# Patient Record
Sex: Male | Born: 1955 | Race: White | Hispanic: No | Marital: Married | Smoking: Never smoker
Health system: Southern US, Community
[De-identification: ages and names within clinical notes are randomized; demographics above are authoritative.]

## PROBLEM LIST (undated history)

## (undated) DIAGNOSIS — I480 Paroxysmal atrial fibrillation: Secondary | ICD-10-CM

## (undated) DIAGNOSIS — J45909 Unspecified asthma, uncomplicated: Secondary | ICD-10-CM

## (undated) DIAGNOSIS — D3140 Benign neoplasm of unspecified ciliary body: Secondary | ICD-10-CM

## (undated) DIAGNOSIS — Q150 Congenital glaucoma: Secondary | ICD-10-CM

## (undated) DIAGNOSIS — Q245 Malformation of coronary vessels: Secondary | ICD-10-CM

## (undated) DIAGNOSIS — I25110 Atherosclerotic heart disease of native coronary artery with unstable angina pectoris: Principal | ICD-10-CM

## (undated) DIAGNOSIS — Z9841 Cataract extraction status, right eye: Secondary | ICD-10-CM

## (undated) DIAGNOSIS — E079 Disorder of thyroid, unspecified: Secondary | ICD-10-CM

## (undated) DIAGNOSIS — Z950 Presence of cardiac pacemaker: Secondary | ICD-10-CM

## (undated) DIAGNOSIS — Z961 Presence of intraocular lens: Secondary | ICD-10-CM

## (undated) DIAGNOSIS — K219 Gastro-esophageal reflux disease without esophagitis: Secondary | ICD-10-CM

## (undated) DIAGNOSIS — E785 Hyperlipidemia, unspecified: Secondary | ICD-10-CM

## (undated) DIAGNOSIS — Z947 Corneal transplant status: Secondary | ICD-10-CM

## (undated) DIAGNOSIS — H40051 Ocular hypertension, right eye: Secondary | ICD-10-CM

## (undated) DIAGNOSIS — I10 Essential (primary) hypertension: Secondary | ICD-10-CM

## (undated) DIAGNOSIS — Z955 Presence of coronary angioplasty implant and graft: Secondary | ICD-10-CM

## (undated) DIAGNOSIS — H18232 Secondary corneal edema, left eye: Secondary | ICD-10-CM

## (undated) DIAGNOSIS — H26499 Other secondary cataract, unspecified eye: Secondary | ICD-10-CM

## (undated) DIAGNOSIS — G4733 Obstructive sleep apnea (adult) (pediatric): Secondary | ICD-10-CM

## (undated) DIAGNOSIS — Z9842 Cataract extraction status, left eye: Secondary | ICD-10-CM

## (undated) HISTORY — DX: Cataract extraction status, right eye: Z98.41

## (undated) HISTORY — DX: Cataract extraction status, left eye: Z98.42

## (undated) HISTORY — DX: Disorder of thyroid, unspecified: E07.9

## (undated) HISTORY — DX: Gastro-esophageal reflux disease without esophagitis: K21.9

## (undated) HISTORY — DX: Benign neoplasm of unspecified ciliary body: D31.40

## (undated) HISTORY — DX: Essential (primary) hypertension: I10

## (undated) HISTORY — DX: Congenital glaucoma: Q15.0

## (undated) HISTORY — PX: WISDOM TOOTH EXTRACTION: SHX21

## (undated) HISTORY — DX: Obstructive sleep apnea (adult) (pediatric): G47.33

## (undated) HISTORY — PX: APPENDECTOMY: SHX54

## (undated) HISTORY — DX: Secondary corneal edema, left eye: H18.232

## (undated) HISTORY — DX: Other secondary cataract, unspecified eye: H26.499

## (undated) HISTORY — PX: CATARACT EXTRACTION: SUR2

## (undated) HISTORY — DX: Presence of cardiac pacemaker: Z95.0

## (undated) HISTORY — DX: Paroxysmal atrial fibrillation: I48.0

## (undated) HISTORY — DX: Presence of coronary angioplasty implant and graft: Z95.5

## (undated) HISTORY — DX: Hyperlipidemia, unspecified: E78.5

## (undated) HISTORY — DX: Presence of intraocular lens: Z96.1

## (undated) HISTORY — DX: Corneal transplant status: Z94.7

## (undated) HISTORY — DX: Malformation of coronary vessels: Q24.5

## (undated) HISTORY — DX: Unspecified asthma, uncomplicated: J45.909

## (undated) HISTORY — DX: Atherosclerotic heart disease of native coronary artery with unstable angina pectoris: I25.110

---

## 2001-03-30 HISTORY — PX: CATARACT EXTRACTION W/ INTRAOCULAR LENS IMPLANT: SHX1309

## 2012-01-29 HISTORY — PX: CATARACT EXTRACTION W/ INTRAOCULAR LENS IMPLANT: SHX1309

## 2012-04-30 HISTORY — PX: DESCEMETS STRIPPING AUTOMATED ENDOTHELIAL KERATOPLASTY: SHX1454

## 2012-12-23 DIAGNOSIS — Z961 Presence of intraocular lens: Secondary | ICD-10-CM | POA: Insufficient documentation

## 2012-12-23 DIAGNOSIS — H26499 Other secondary cataract, unspecified eye: Secondary | ICD-10-CM | POA: Insufficient documentation

## 2012-12-23 DIAGNOSIS — Q150 Congenital glaucoma: Secondary | ICD-10-CM | POA: Insufficient documentation

## 2012-12-23 DIAGNOSIS — H1820 Unspecified corneal edema: Secondary | ICD-10-CM | POA: Insufficient documentation

## 2013-01-30 DIAGNOSIS — Q134 Other congenital corneal malformations: Secondary | ICD-10-CM | POA: Insufficient documentation

## 2013-03-06 DIAGNOSIS — J101 Influenza due to other identified influenza virus with other respiratory manifestations: Secondary | ICD-10-CM | POA: Insufficient documentation

## 2013-03-15 DIAGNOSIS — Z947 Corneal transplant status: Secondary | ICD-10-CM | POA: Insufficient documentation

## 2013-09-05 DIAGNOSIS — Q150 Congenital glaucoma: Secondary | ICD-10-CM

## 2013-09-05 DIAGNOSIS — D3140 Benign neoplasm of unspecified ciliary body: Secondary | ICD-10-CM | POA: Insufficient documentation

## 2013-09-05 HISTORY — DX: Benign neoplasm of unspecified ciliary body: D31.40

## 2013-09-05 HISTORY — DX: Congenital glaucoma: Q15.0

## 2013-09-15 DIAGNOSIS — E039 Hypothyroidism, unspecified: Secondary | ICD-10-CM | POA: Insufficient documentation

## 2013-09-15 DIAGNOSIS — J45909 Unspecified asthma, uncomplicated: Secondary | ICD-10-CM | POA: Insufficient documentation

## 2013-09-15 DIAGNOSIS — K219 Gastro-esophageal reflux disease without esophagitis: Secondary | ICD-10-CM | POA: Insufficient documentation

## 2013-09-18 HISTORY — PX: GLAUCOMA SURGERY: SHX656

## 2013-09-18 HISTORY — PX: AQUEOUS SHUNT EXTRAOCULAR: SHX351

## 2013-09-18 HISTORY — PX: SCLERAL REINFORCEMENT: SHX371

## 2013-10-18 DIAGNOSIS — H18232 Secondary corneal edema, left eye: Secondary | ICD-10-CM | POA: Insufficient documentation

## 2013-10-18 DIAGNOSIS — Z947 Corneal transplant status: Secondary | ICD-10-CM | POA: Insufficient documentation

## 2013-11-21 DIAGNOSIS — Z9841 Cataract extraction status, right eye: Secondary | ICD-10-CM | POA: Insufficient documentation

## 2013-11-21 DIAGNOSIS — Z9842 Cataract extraction status, left eye: Secondary | ICD-10-CM

## 2013-11-21 HISTORY — DX: Cataract extraction status, left eye: Z98.41

## 2013-11-21 HISTORY — DX: Cataract extraction status, right eye: Z98.42

## 2014-03-07 HISTORY — PX: CARDIAC CATHETERIZATION: SHX172

## 2014-05-24 DIAGNOSIS — E782 Mixed hyperlipidemia: Secondary | ICD-10-CM | POA: Insufficient documentation

## 2014-05-24 DIAGNOSIS — D751 Secondary polycythemia: Secondary | ICD-10-CM | POA: Insufficient documentation

## 2014-05-24 DIAGNOSIS — R55 Syncope and collapse: Secondary | ICD-10-CM | POA: Insufficient documentation

## 2014-05-24 DIAGNOSIS — G4733 Obstructive sleep apnea (adult) (pediatric): Secondary | ICD-10-CM | POA: Insufficient documentation

## 2014-05-24 DIAGNOSIS — Z9989 Dependence on other enabling machines and devices: Secondary | ICD-10-CM

## 2014-05-24 DIAGNOSIS — IMO0002 Reserved for concepts with insufficient information to code with codable children: Secondary | ICD-10-CM | POA: Insufficient documentation

## 2014-05-28 DIAGNOSIS — R0602 Shortness of breath: Secondary | ICD-10-CM | POA: Insufficient documentation

## 2014-06-10 DIAGNOSIS — R7303 Prediabetes: Secondary | ICD-10-CM | POA: Insufficient documentation

## 2014-09-24 DIAGNOSIS — I4891 Unspecified atrial fibrillation: Secondary | ICD-10-CM | POA: Insufficient documentation

## 2014-09-28 HISTORY — PX: PACEMAKER INSERTION: SHX728

## 2014-11-21 LAB — PROTIME-INR

## 2015-01-17 HISTORY — PX: CORNEAL TRANSPLANT: SHX108

## 2016-01-15 ENCOUNTER — Telehealth: Payer: Self-pay | Admitting: Internal Medicine

## 2016-01-15 NOTE — Telephone Encounter (Signed)
Received records from [facility] for appointment on 02/28/16 with Dr Debara Pickett.  Records given to [facility] (medical records) for Dr Lysbeth Penner schedule on 02/28/16. lp

## 2016-02-28 ENCOUNTER — Telehealth: Payer: Self-pay | Admitting: Internal Medicine

## 2016-02-28 ENCOUNTER — Ambulatory Visit (INDEPENDENT_AMBULATORY_CARE_PROVIDER_SITE_OTHER): Payer: BLUE CROSS/BLUE SHIELD | Admitting: Internal Medicine

## 2016-02-28 ENCOUNTER — Encounter: Payer: Self-pay | Admitting: Internal Medicine

## 2016-02-28 VITALS — BP 108/74 | HR 69 | Ht 70.0 in | Wt 267.0 lb

## 2016-02-28 DIAGNOSIS — I480 Paroxysmal atrial fibrillation: Secondary | ICD-10-CM | POA: Diagnosis not present

## 2016-02-28 DIAGNOSIS — Z955 Presence of coronary angioplasty implant and graft: Secondary | ICD-10-CM

## 2016-02-28 DIAGNOSIS — I25110 Atherosclerotic heart disease of native coronary artery with unstable angina pectoris: Secondary | ICD-10-CM

## 2016-02-28 DIAGNOSIS — I10 Essential (primary) hypertension: Secondary | ICD-10-CM | POA: Diagnosis not present

## 2016-02-28 DIAGNOSIS — Z9889 Other specified postprocedural states: Secondary | ICD-10-CM

## 2016-02-28 DIAGNOSIS — Z950 Presence of cardiac pacemaker: Secondary | ICD-10-CM

## 2016-02-28 DIAGNOSIS — Z9861 Coronary angioplasty status: Secondary | ICD-10-CM

## 2016-02-28 DIAGNOSIS — I2510 Atherosclerotic heart disease of native coronary artery without angina pectoris: Secondary | ICD-10-CM | POA: Insufficient documentation

## 2016-02-28 DIAGNOSIS — Q245 Malformation of coronary vessels: Secondary | ICD-10-CM

## 2016-02-28 HISTORY — DX: Presence of cardiac pacemaker: Z95.0

## 2016-02-28 HISTORY — DX: Atherosclerotic heart disease of native coronary artery with unstable angina pectoris: I25.110

## 2016-02-28 HISTORY — DX: Malformation of coronary vessels: Q24.5

## 2016-02-28 HISTORY — DX: Essential (primary) hypertension: I10

## 2016-02-28 HISTORY — DX: Presence of coronary angioplasty implant and graft: Z95.5

## 2016-02-28 HISTORY — DX: Paroxysmal atrial fibrillation: I48.0

## 2016-02-28 LAB — TSH: TSH: 3.92 mIU/L (ref 0.40–4.50)

## 2016-02-28 LAB — LIPID PANEL
CHOL/HDL RATIO: 3.5 ratio (ref <5.0)
CHOLESTEROL: 115 mg/dL (ref <200)
HDL: 33 mg/dL — AB (ref >40)
LDL Cholesterol: 52 mg/dL (ref <100)
TRIGLYCERIDES: 150 mg/dL — AB (ref <150)
VLDL: 30 mg/dL (ref <30)

## 2016-02-28 LAB — COMPREHENSIVE METABOLIC PANEL
ALBUMIN: 4.4 g/dL (ref 3.6–5.1)
ALT: 50 U/L — ABNORMAL HIGH (ref 9–46)
AST: 26 U/L (ref 10–35)
Alkaline Phosphatase: 82 U/L (ref 40–115)
BUN: 17 mg/dL (ref 7–25)
CALCIUM: 9.1 mg/dL (ref 8.6–10.3)
CO2: 26 mmol/L (ref 20–31)
Chloride: 101 mmol/L (ref 98–110)
Creat: 1.04 mg/dL (ref 0.70–1.33)
GLUCOSE: 118 mg/dL — AB (ref 65–99)
POTASSIUM: 4.3 mmol/L (ref 3.5–5.3)
Sodium: 138 mmol/L (ref 135–146)
Total Bilirubin: 1 mg/dL (ref 0.2–1.2)
Total Protein: 6.4 g/dL (ref 6.1–8.1)

## 2016-02-28 LAB — CBC
HEMATOCRIT: 45.5 % (ref 38.5–50.0)
Hemoglobin: 15.1 g/dL (ref 13.2–17.1)
MCH: 28.5 pg (ref 27.0–33.0)
MCHC: 33.2 g/dL (ref 32.0–36.0)
MCV: 86 fL (ref 80.0–100.0)
MPV: 8.7 fL (ref 7.5–12.5)
PLATELETS: 209 10*3/uL (ref 140–400)
RBC: 5.29 MIL/uL (ref 4.20–5.80)
RDW: 14.8 % (ref 11.0–15.0)
WBC: 5.1 10*3/uL (ref 3.8–10.8)

## 2016-02-28 MED ORDER — ISOSORBIDE MONONITRATE ER 60 MG PO TB24: 60.0000 mg | ORAL_TABLET | Freq: Every day | ORAL | 6 refills | Status: DC

## 2016-02-28 MED ORDER — METOPROLOL SUCCINATE ER 50 MG PO TB24: 50.0000 mg | ORAL_TABLET | Freq: Two times a day (BID) | ORAL | 12 refills | Status: DC

## 2016-02-28 MED ORDER — METOPROLOL SUCCINATE ER 25 MG PO TB24: 37.5000 mg | ORAL_TABLET | Freq: Every day | ORAL | 12 refills | Status: DC

## 2016-02-28 NOTE — Progress Notes (Signed)
OFFICE NOTE  Chief Complaint:  Chest pain, additional opinion  Primary Care Physician: No PCP Per Patient  HPI:  Thomas Walsh is a 60 y.o. male who was referred from another patient of mine who is his brother-in-law. His primary physician is [name], MD and he sees Dr. Marcello Moores of [facility] in [place], [place]. Thomas Walsh has a very complex cardiac history. He was noted to have had a left heart catheterization at [facility] in 2000 for profound symptomatic bradycardia with heart rate of 30s related to timolol eyedrops, but was found to have no obstructive coronary disease at the time. There is a family history of premature coronary artery disease Apparently in late 2015 he saw Dr. Marcello Moores in the office because of chest discomfort. He had described that as "poking, pressure and squeezing which awoke him from sleep". He was scheduled to have a stress test however during the EKG portion of the stress test there were changes suggestive of ischemia and the test was discontinued. Interpretation of that nuclear stress test was "myocardial perfusion imaging is positive for inducible ischemia. There are regions of reversible ischemia located in the inferior wall of the heart. Overall left ventricular systolic function is normal without regional wall motion abnormalities noted above." He was then going to be scheduled for elective heart catheterization however he presented to the emergency department due to continued chest pain. He underwent left heart catheterization on 03/07/2014. 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 After the procedure however he continued to have chest pain symptoms. He was placed on aspirin and Effient and started on low-dose calcium channel  blocker which caused low blood pressure and was discontinued. Cardiac markers were noted to be mildly elevated the next day at 0.41 troponin which was previously negative on 03/06/2014. LDL at that time was 52. He was then admitted to [facility] in [place], [place] for chest pain in January 2016. He also complained of palpitations and was found to have an ectopic atrial rhythm with normal ventricular response. He claims that he had had significant tachycardia and was thought to have had an SVT. He reports he subsequently presented and required treatment for an SVT on another occasion when he presented to the hospital around [place], [place]. He said that he received adenosine 6 mg and then 12 mg and ultimately converted to sinus. He then was reportedly referred to Dr. Nyra Market in [facility] who scheduled him for ablation. Apparently afterwards he reports that the ablation was not totally successful however a pacemaker was required to be placed - this leads me to believe that there may have been AV nodal damage at the time. 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 The pacemaker is by his report a Medtronic device and he does have a home monitoring. He said most recently he was 94% paced. He has had progressive antianginal regimens including Ranexa and topical nitrate. 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 He underwent his last cardiac catheterization in September 2017 which demonstrated only 30% stenosis at the distal edge of the mid LAD stent. FFR was performed which was 0.89. Apparently he was continuing to have recurrent arrhythmias. Thomas Walsh also has had an opinion from a  cardiologist at [facility]. I cannot find records in care everywhere to support this however he does have some paperwork indicating he had an appointment. He does not feel that that appointment was helpful. He did bring a CD with images of his echo  and cath which I will review. His main complaint today is continued chest pain.   PMHx:  Past Medical History:  Diagnosis Date  . Coronary artery disease involving native coronary artery of native heart with unstable angina pectoris ([place]) 02/28/2016  . Essential hypertension 02/28/2016  . Glaucoma   . 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 PAF (paroxysmal atrial fibrillation) ([place]) 02/28/2016  . S/P placement of cardiac pacemaker 02/28/2016   Medtronic  . S/P primary angioplasty with coronary stent 02/28/2016  . Thyroid disease     History reviewed. No pertinent surgical history.  FAMHx:  Family History  Problem Relation Age of Onset  . Cancer Mother   . Kidney disease Father     SOCHx:   reports that he has never smoked. He has never used smokeless tobacco. He reports that he drinks about 0.6 oz of alcohol per week . He reports that he does not use drugs.  ALLERGIES:  Allergies  Allergen Reactions  . Timolol Anaphylaxis    Stops heart     ROS: Pertinent items noted in HPI and remainder of comprehensive ROS otherwise negative.  HOME MEDS: No current outpatient prescriptions on file prior to visit.   No current facility-administered medications on file prior to visit.     LABS/IMAGING: No results found for this or any previous visit (from the past 48 hour(s)). No results found.  WEIGHTS: Wt Readings from Last 3 Encounters:  02/28/16 267 lb (121.1 kg)    VITALS: BP 108/74   Pulse 69   Ht 5\' 10"  (1.778 m)   Wt 267 lb (121.1 kg)   BMI 38.31 kg/m   EXAM: General appearance: alert, no distress and moderately obese Neck: no carotid bruit and no JVD Lungs: clear to auscultation bilaterally Heart: regular rate and rhythm and S1, S2 normal Abdomen: soft, non-tender; bowel sounds normal; no masses,  no organomegaly Extremities: extremities normal, atraumatic, no cyanosis or edema Pulses: 2+ and symmetric Skin: Skin color, texture, turgor normal. No rashes or  lesions Neurologic: Grossly normal Psych: Pleasant  EKG: Normal sinus rhythm at 69, moderate voltage criteria for LVH, nonspecific T-wave changes  ASSESSMENT: 1. Coronary artery disease with persistent angina 2. 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 History of SVT with ablation and need for subsequent PPM 4. S/p PPM (Medtronic) 5. Dyslipidemia 6. Hypertension  PLAN: 1.   Thomas Walsh has a complex cardiac history which started with chest pain symptoms and ultimately he was noted to have a reversible defect inferiorly on the stress test but during cardiac catheterization had a 99% mid LAD stenosis. Reports indicate that this was stented however was also thought to be a myocardial bridge. I discussed with Thomas Walsh today that it is not standard of care to put stents in myocardial bridges, in fact the myocardial bridge rarely develops any obstructive coronary artery disease for unknown reasons. Typically stenting leads to more problems including recurrent anginal symptoms. I'm not surprised that he is required recurrent interventions to that vessel. It was thought that he may ultimately need coronary artery bypass grafting. Because of the depth of the myocardial bridge she was told that he was not a candidate for coronary on roofing, but he  has not had either cardiac MRI or CT to definitively evaluate this. I agree that additional medical therapy should be tried before considering bypass. I would recommend increasing his Toprol from 75 mg to 100 mg daily. In addition, I will switch his nitroglycerin patch over to indoor 60 mg daily. We will schedule him for our EP clinic and have his pacemaker device interrogated. I will request additional Cath Lab films and review his echo and films that I was provided with. Follow-up with me in about one month.  Pixie Casino, MD, [employer] Attending Cardiologist [place] 02/28/2016, 4:08 PM

## 2016-02-28 NOTE — Telephone Encounter (Signed)
[employer]) is calling because there were two prescriptions sent for Metoprolol and is needing verification on which one to use . Please call   Thanks

## 2016-02-28 NOTE — Telephone Encounter (Signed)
Spoke with Delisha and made her aware that pt's Toprol XL was increased to 50mg  BID today.  [facility] appreciative for assistance.

## 2016-02-28 NOTE — Patient Instructions (Addendum)
Medication Instructions:   INCREASE METOPROLOL TO 50 MG TWICE DAILY  STOP NITRO DUR PATCH  START ISOSORBIDE 60 MG ONCE DAILY  Labwork:  Your physician recommends that you HAVE LAB WORK TODAY  Follow-Up:  Your physician recommends that you schedule a follow-up appointment in: ONE MONTH WITH DR HILTY   REFERRAL TO EP WITHIN 2 WEEKS FOR HX OF SVT ABLATION/PACER AND PALPITATIONS

## 2016-03-02 ENCOUNTER — Telehealth: Payer: Self-pay | Admitting: Internal Medicine

## 2016-03-02 NOTE — Telephone Encounter (Signed)
Received records from [facility] per Dr [facility] request.  Records given to Dr Debara Pickett for review.  Patient has appointment with Dr Debara Pickett also on 04/03/16.  Patient has appointment with Dr Lovena Le on 03/10/16.  Sent copy of records to [facility] Chart Prep for Dr Tanna Furry schedule on 03/10/16. lp

## 2016-03-02 NOTE — Telephone Encounter (Signed)
Faxed Release signed by patient to [facility] to obtain records per Dr [facility] request.  Faxed 03/02/16 - [phone])  lp

## 2016-03-04 ENCOUNTER — Encounter: Payer: Self-pay | Admitting: *Deleted

## 2016-03-05 ENCOUNTER — Encounter: Payer: Self-pay | Admitting: *Deleted

## 2016-03-05 ENCOUNTER — Encounter: Payer: Self-pay | Admitting: Internal Medicine

## 2016-03-05 DIAGNOSIS — G4730 Sleep apnea, unspecified: Secondary | ICD-10-CM | POA: Insufficient documentation

## 2016-03-10 ENCOUNTER — Ambulatory Visit (INDEPENDENT_AMBULATORY_CARE_PROVIDER_SITE_OTHER): Payer: BLUE CROSS/BLUE SHIELD | Admitting: Internal Medicine

## 2016-03-10 ENCOUNTER — Encounter: Payer: Self-pay | Admitting: Internal Medicine

## 2016-03-10 VITALS — BP 90/64 | HR 78 | Ht 70.0 in | Wt 269.6 lb

## 2016-03-10 DIAGNOSIS — R079 Chest pain, unspecified: Secondary | ICD-10-CM | POA: Diagnosis not present

## 2016-03-10 DIAGNOSIS — R0602 Shortness of breath: Secondary | ICD-10-CM

## 2016-03-10 DIAGNOSIS — Z950 Presence of cardiac pacemaker: Secondary | ICD-10-CM | POA: Diagnosis not present

## 2016-03-10 DIAGNOSIS — R002 Palpitations: Secondary | ICD-10-CM | POA: Diagnosis not present

## 2016-03-10 NOTE — Progress Notes (Signed)
HPI [name] is referred by Dr. Debara Pickett for ongoing evaluation of SVT, s/p ablation, and complicated by the insertion of a DDD PM. He has known CAD and his history has been reviewed and notable for at least 4 cath/PCI procedures. His main complaint today is dyspnea with exertion, and overall fatigue. He has trouble walking up a grade andtrouble walking fast. He is overweight, having gained over a 100 lbs since high school graduation. He denies peripheral edema. No syncope and no systemic symptoms like fever or chills. Allergies  Allergen Reactions  . Timolol Anaphylaxis    Cardiac stand still  Stops heart      Current Outpatient Prescriptions  Medication Sig Dispense Refill  . aspirin 325 MG tablet Take 325 mg by mouth daily.    Marland Kitchen atorvastatin (LIPITOR) 40 MG tablet Take 40 mg by mouth daily.    . Brinzolamide-Brimonidine (SIMBRINZA) 1-0.2 % SUSP Apply 1 drop to eye 3 (three) times daily.    Marland Kitchen ezetimibe (ZETIA) 10 MG tablet Take 10 mg by mouth daily.    . isosorbide mononitrate (IMDUR) 60 MG 24 hr tablet Take 1 tablet (60 mg total) by mouth daily. 30 tablet 6  . levothyroxine (SYNTHROID, LEVOTHROID) 100 MCG tablet Take 100 mcg by mouth daily before breakfast.    . liraglutide (VICTOZA) 18 MG/3ML SOPN Inject 18 mg into the skin daily.    Marland Kitchen lisinopril (PRINIVIL,ZESTRIL) 10 MG tablet Take 10 mg by mouth daily.    . metoprolol succinate (TOPROL-XL) 50 MG 24 hr tablet Take 1 tablet (50 mg total) by mouth 2 (two) times daily. 60 tablet 12  . nitroGLYCERIN (NITROSTAT) 0.4 MG SL tablet Place 0.4 mg under the tongue every 5 (five) minutes as needed for chest pain.    Marland Kitchen omeprazole (PRILOSEC) 40 MG capsule Take 40 mg by mouth 2 (two) times daily.    . prasugrel (EFFIENT) 10 MG TABS tablet Take 10 mg by mouth daily.    . ranolazine (RANEXA) 500 MG 12 hr tablet Take 1,000 mg by mouth 2 (two) times daily.     No current facility-administered medications for this visit.      Past Medical  History:  Diagnosis Date  . Asthma   . 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 Coronary artery disease involving native coronary artery of native heart with unstable angina pectoris ([place]) 02/28/2016  . Essential hypertension 02/28/2016  . GERD (gastroesophageal reflux disease)   . HLD (hyperlipidemia)   . 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 OSA treated with BiPAP   . PAF (paroxysmal atrial fibrillation) ([place]) 02/28/2016  . PCO (posterior capsular opacification)   . Pseudophakia of both eyes   . S/P bilateral cataract extraction 11/21/2013  . S/P placement of cardiac pacemaker 02/28/2016   Medtronic  . S/P primary angioplasty with coronary stent 02/28/2016  . Secondary corneal edema of left eye   . Status post corneal transplant   . Thyroid disease     ROS:   All systems reviewed and negative except as noted in the HPI.   Past Surgical History:  Procedure Laterality Date  . APPENDECTOMY    . AQUEOUS SHUNT EXTRAOCULAR Left 09/18/2013   Elenore Rota L. Pennie Banter, MD [address] [facility]  . CARDIAC CATHETERIZATION  03/07/2014  . CATARACT EXTRACTION Bilateral   . CATARACT EXTRACTION W/ INTRAOCULAR LENS IMPLANT Left 01/2012   McCuen, MD  . CATARACT EXTRACTION W/ INTRAOCULAR LENS IMPLANT Right 2003   [facility]  . CORNEAL  TRANSPLANT Left 01/17/2015   Keratoplasty, Endothelial. Nelma Rothman, MD [place]   . DESCEMETS STRIPPING AUTOMATED ENDOTHELIAL KERATOPLASTY Left 04/2012   Marilynne Halsted, MD;  . GLAUCOMA SURGERY Left 09/18/2013   [place] BG-101 implant with cornea graft  . PACEMAKER INSERTION  09/2014   Done at [facility].   [place] Levo REINFORCEMENT Left 09/18/2013   with graft, Edson Snowball, MD, [facility]  . WISDOM TOOTH EXTRACTION       Family History  Problem Relation Age of Onset  . Colon cancer Mother   . Hypertension Mother   . Kidney disease Father   . CAD Father   . Stroke Father   . Spina bifida Sister   . Thyroid disease Sister   . Diabetes Brother   .  Retinal detachment Brother      Social History   Social History  . Marital status: Married    Spouse name: N/A  . Number of children: N/A  . Years of education: N/A   Occupational History  . [employer]     Social History Main Topics  . Smoking status: Never Smoker  . Smokeless tobacco: Never Used  . Alcohol use 0.6 oz/week    1 Shots of liquor per week  . Drug use: No  . Sexual activity: Not on file   Other Topics Concern  . Not on file   Social History Narrative  . No narrative on file     BP 90/64 (BP Location: Left Arm, Patient Position: Sitting, Cuff Size: Large)   Pulse 78   Ht 5\' 10"  (1.778 m)   Wt 269 lb 9.6 oz (122.3 kg)   BMI 38.68 kg/m   Physical Exam:  obese appearing middle aged man, NAD HEENT: Unremarkable Neck:  7 cm JVD, no thyromegally Lymphatics:  No adenopathy Back:  No CVA tenderness Lungs:  Clear with no wheezes, rales, or rhonchi. Well healed PPM incision. HEART:  Regular rate rhythm, no murmurs, no rubs, no clicks Abd:  soft, positive bowel sounds, no organomegally, no rebound, no guarding Ext:  2 plus pulses, no edema, no cyanosis, no clubbing Skin:  No rashes no nodules Neuro:  CN II through XII intact, motor grossly intact  DEVICE  Normal device function.  See PaceArt for details.   Assess/Plan: 1. Dyspnea with exertion - the etiology is unclear. He is not pacing and is not having any arrhythmias. No angina. He is frustrated by his inability to exert himself. I have recommended he undergo cardiopulmonary stress testing.  2. CAD - he is s/p multiple stenting/PCI procedures.  3. PPM - his medtronic PPM is working normally. He does not have CHB today and is sensing in both the atrium and the ventricle. 4. SVT - he has had no SVT in over a year based on interogation of his PPM. I have recommended watchful waiting. No indication for EP study at this time. He will continue his beta blocker.  Mikle Bosworth.D.

## 2016-03-10 NOTE — Patient Instructions (Addendum)
Medication Instructions:  Your physician recommends that you continue on your current medications as directed. Please refer to the Current Medication list given to you today.   Labwork: None Ordered   Testing/Procedures: Cardiopulmonary Exercise Test   Follow-Up: Follow-up to be determined after results of Cardiopulmonary Exercise Test.     Any Other Special Instructions Will Be Listed Below (If Applicable).     If you need a refill on your cardiac medications before your next appointment, please call your pharmacy.

## 2016-03-10 NOTE — Progress Notes (Signed)
I have reviewed Thomas Walsh records from [facility] cardi[facility] in [place], [place]. He was treated by Dr. Thornton Dales. The records indicate he underwent placement of a Medtronic advise a DR MRI sure scan pacemaker on 10/18/2014 for sick sinus syndrome, dizziness and fatigue. He has a history of paroxysmal atrial fibrillation however reportedly a very low burden of A. fib. 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   Pixie Casino, MD, [facility] Attending Cardiologist [place]

## 2016-03-16 ENCOUNTER — Encounter: Payer: Self-pay | Admitting: Internal Medicine

## 2016-04-02 ENCOUNTER — Other Ambulatory Visit (HOSPITAL_COMMUNITY): Payer: Self-pay | Admitting: *Deleted

## 2016-04-02 ENCOUNTER — Ambulatory Visit (HOSPITAL_COMMUNITY): Payer: BLUE CROSS/BLUE SHIELD | Attending: Internal Medicine

## 2016-04-02 DIAGNOSIS — R0602 Shortness of breath: Secondary | ICD-10-CM | POA: Insufficient documentation

## 2016-04-02 DIAGNOSIS — R079 Chest pain, unspecified: Secondary | ICD-10-CM

## 2016-04-02 DIAGNOSIS — R0600 Dyspnea, unspecified: Secondary | ICD-10-CM | POA: Diagnosis not present

## 2016-04-03 ENCOUNTER — Ambulatory Visit (INDEPENDENT_AMBULATORY_CARE_PROVIDER_SITE_OTHER): Payer: BLUE CROSS/BLUE SHIELD | Admitting: Internal Medicine

## 2016-04-03 ENCOUNTER — Encounter: Payer: Self-pay | Admitting: Internal Medicine

## 2016-04-03 VITALS — BP 112/70 | HR 75 | Ht 70.0 in | Wt 266.8 lb

## 2016-04-03 DIAGNOSIS — Z950 Presence of cardiac pacemaker: Secondary | ICD-10-CM | POA: Diagnosis not present

## 2016-04-03 DIAGNOSIS — I25110 Atherosclerotic heart disease of native coronary artery with unstable angina pectoris: Secondary | ICD-10-CM

## 2016-04-03 DIAGNOSIS — Q245 Malformation of coronary vessels: Secondary | ICD-10-CM

## 2016-04-03 DIAGNOSIS — R002 Palpitations: Secondary | ICD-10-CM

## 2016-04-03 DIAGNOSIS — I10 Essential (primary) hypertension: Secondary | ICD-10-CM

## 2016-04-03 DIAGNOSIS — R0602 Shortness of breath: Secondary | ICD-10-CM | POA: Diagnosis not present

## 2016-04-03 NOTE — Patient Instructions (Signed)
Your physician wants you to follow-up in: 6 months with Dr. Hilty. You will receive a reminder letter in the mail two months in advance. If you don't receive a letter, please call our office to schedule the follow-up appointment.    

## 2016-04-03 NOTE — Progress Notes (Signed)
OFFICE NOTE  Chief Complaint:  Follow-up testing  Primary Care Physician: Pcp Not In System  HPI:  Thomas Walsh is a 61 y.o. male who was referred from another patient of mine who is his brother-in-law. His primary physician is [name], MD and he sees Dr. Marcello Moores of [facility] in [place], [place]. [name] has a very complex cardiac history. He was noted to have had a left heart catheterization at [facility] in 2000 for profound symptomatic bradycardia with heart rate of 30s related to timolol eyedrops, but was found to have no obstructive coronary disease at the time. There is a family history of premature coronary artery disease Apparently in late 2015 he saw Dr. Marcello Moores in the office because of chest discomfort. He had described that as "poking, pressure and squeezing which awoke him from sleep". He was scheduled to have a stress test however during the EKG portion of the stress test there were changes suggestive of ischemia and the test was discontinued. Interpretation of that nuclear stress test was "myocardial perfusion imaging is positive for inducible ischemia. There are regions of reversible ischemia located in the inferior wall of the heart. Overall left ventricular systolic function is normal without regional wall motion abnormalities noted above." He was then going to be scheduled for elective heart catheterization however he presented to the emergency department due to continued chest pain. He underwent left heart catheterization on 03/07/2014. 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 After the procedure however he continued to have chest pain symptoms. He was placed on aspirin and Effient and started on low-dose calcium channel blocker which  caused low blood pressure and was discontinued. Cardiac markers were noted to be mildly elevated the next day at 0.41 troponin which was previously negative on 03/06/2014. LDL at that time was 52. He was then admitted to [facility] in [place], [place] for chest pain in January 2016. He also complained of palpitations and was found to have an ectopic atrial rhythm with normal ventricular response. He claims that he had had significant tachycardia and was thought to have had an SVT. He reports he subsequently presented and required treatment for an SVT on another occasion when he presented to the hospital around [place], [place]. He said that he received adenosine 6 mg and then 12 mg and ultimately converted to sinus. He then was reportedly referred to Dr. Nyra Market in [facility] who scheduled him for ablation. Apparently afterwards he reports that the ablation was not totally successful however a pacemaker was required to be placed - this leads me to believe that there may have been AV nodal damage at the time. 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 The pacemaker is by his report a Medtronic device and he does have a home monitoring. He said most recently he was 94% paced. He has had progressive antianginal regimens including Ranexa and topical nitrate. 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 He underwent his last cardiac catheterization in September 2017 which demonstrated only 30% stenosis at the distal edge of the mid LAD stent. FFR was performed which was 0.89. Apparently he was continuing to have recurrent arrhythmias. Mr. Kimery also has had an opinion from a cardiologist at  [facility]. I cannot find records in care everywhere to support this however he does have some paperwork indicating he had an appointment. He does not feel that that appointment was helpful. He did bring a CD with images of his echo and cath which I  will review. His main complaint today is continued chest pain.   04/03/2016  [name] returns today for follow-up. I was able to review his extensive records and understand he is history including placement of a Medtronic advise a MRI safe pacemaker on 10/18/2014 for sick sinus syndrome. Subsequently he developed some PAF but reportedly has a very low burden of this. 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 Recently he saw Dr. Lovena Le and was not felt to be in heart block or to be pacer dependent. Because of his dyspnea he was scheduled for a cardiopulmonary exercise test. Mr. Peragine did undergo that study which indicated no significant circulatory limitation to exercise. His peak V02 was 103%. Pulmonary function was within normal limits. It was felt that likely his symptoms were related to obesity. These are preliminary results pending a cardiology over read, but I agree with those findings. He says that he said much less angina since adjusting his medications. He is now on imdur 60 mg daily as compared to his nitroglycerin patch. Although he has some mild headaches he says that he has not needed any short acting nitroglycerin in the last 3 weeks. He is also on an increased dose of metoprolol, namely 100 mg daily. He is generally pleased with his improvement in symptoms.  PMHx:  Past Medical History:  Diagnosis Date  . Asthma   . 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 Coronary artery disease involving native coronary artery of native heart with unstable angina pectoris ([place]) 02/28/2016  . Essential hypertension 02/28/2016  . GERD (gastroesophageal reflux disease)   . HLD (hyperlipidemia)   . 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 OSA treated with BiPAP   . PAF (paroxysmal atrial fibrillation) ([place]) 02/28/2016  . PCO (posterior capsular opacification)   . Pseudophakia of both eyes   . S/P bilateral  cataract extraction 11/21/2013  . S/P placement of cardiac pacemaker 02/28/2016   Medtronic  . S/P primary angioplasty with coronary stent 02/28/2016  . Secondary corneal edema of left eye   . Status post corneal transplant   . Thyroid disease     Past Surgical History:  Procedure Laterality Date  . APPENDECTOMY    . AQUEOUS SHUNT EXTRAOCULAR Left 09/18/2013   Elenore Rota L. Pennie Banter, MD [facility]  . CARDIAC CATHETERIZATION  03/07/2014  . CATARACT EXTRACTION Bilateral   . CATARACT EXTRACTION W/ INTRAOCULAR LENS IMPLANT Left 01/2012   McCuen, MD  . CATARACT EXTRACTION W/ INTRAOCULAR LENS IMPLANT Right 2003   [place]  . CORNEAL TRANSPLANT Left 01/17/2015   Keratoplasty, Endothelial. Nelma Rothman, MD [place]   . DESCEMETS STRIPPING AUTOMATED ENDOTHELIAL KERATOPLASTY Left 04/2012   Marilynne Halsted, MD;  . GLAUCOMA SURGERY Left 09/18/2013   Baerveldt BG-101 implant with cornea graft  . PACEMAKER INSERTION  09/2014   Done at [facility].   [place] Levo REINFORCEMENT Left 09/18/2013   with graft, Edson Snowball, [facility], [facility]  . WISDOM TOOTH EXTRACTION      FAMHx:  Family History  Problem Relation Age of Onset  . Colon cancer Mother   . Hypertension Mother   .  Kidney disease Father   . CAD Father   . Stroke Father   . Spina bifida Sister   . Thyroid disease Sister   . Diabetes Brother   . Retinal detachment Brother     SOCHx:   reports that he has never smoked. He has never used smokeless tobacco. He reports that he drinks about 0.6 oz of alcohol per week . He reports that he does not use drugs.  ALLERGIES:  Allergies  Allergen Reactions  . Timolol Anaphylaxis    Cardiac stand still  Stops heart     ROS: Pertinent items noted in HPI and remainder of comprehensive ROS otherwise negative.  HOME MEDS: Current Outpatient Prescriptions on File Prior to Visit  Medication Sig Dispense Refill  . aspirin 325 MG tablet Take 325 mg by mouth daily.    Marland Kitchen atorvastatin (LIPITOR)  40 MG tablet Take 40 mg by mouth daily.    . Brinzolamide-Brimonidine (SIMBRINZA) 1-0.2 % SUSP Apply 1 drop to eye 3 (three) times daily.    Marland Kitchen ezetimibe (ZETIA) 10 MG tablet Take 10 mg by mouth daily.    . isosorbide mononitrate (IMDUR) 60 MG 24 hr tablet Take 1 tablet (60 mg total) by mouth daily. 30 tablet 6  . levothyroxine (SYNTHROID, LEVOTHROID) 100 MCG tablet Take 100 mcg by mouth daily before breakfast.    . liraglutide (VICTOZA) 18 MG/3ML SOPN Inject 18 mg into the skin daily.    Marland Kitchen lisinopril (PRINIVIL,ZESTRIL) 10 MG tablet Take 10 mg by mouth daily.    . metoprolol succinate (TOPROL-XL) 50 MG 24 hr tablet Take 1 tablet (50 mg total) by mouth 2 (two) times daily. 60 tablet 12  . nitroGLYCERIN (NITROSTAT) 0.4 MG SL tablet Place 0.4 mg under the tongue every 5 (five) minutes as needed for chest pain.    Marland Kitchen omeprazole (PRILOSEC) 40 MG capsule Take 40 mg by mouth 2 (two) times daily.    . prasugrel (EFFIENT) 10 MG TABS tablet Take 10 mg by mouth daily.    . ranolazine (RANEXA) 500 MG 12 hr tablet Take 1,000 mg by mouth 2 (two) times daily.     No current facility-administered medications on file prior to visit.     LABS/IMAGING: No results found for this or any previous visit (from the past 48 hour(s)). No results found.  WEIGHTS: Wt Readings from Last 3 Encounters:  04/03/16 266 lb 12.8 oz (121 kg)  03/10/16 269 lb 9.6 oz (122.3 kg)  02/28/16 267 lb (121.1 kg)    VITALS: BP 112/70   Pulse 75   Ht 5\' 10"  (1.778 m)   Wt 266 lb 12.8 oz (121 kg)   SpO2 96%   BMI 38.28 kg/m   EXAM: Deferred  EKG: Deferred  ASSESSMENT: 1. Coronary artery disease with persistent angina 2. 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 History of SVT with ablation and need for subsequent PPM 4. S/p PPM (Medtronic) 5. Dyslipidemia 6. Hypertension  PLAN: 1.   Mr. Baltierra has had symptomatic improvement with less angina over the past 2-3 weeks. He had a  cardiopulmonary exercise test which was negative for any circulatory limitation and suggested that his dyspnea may be related to deconditioning and obesity. He is not pacemaker dependent and has been successfully enrolled in the pacer clinic. I would recommend continuing medical therapy and advised him to work on increased exercise and significant weight loss.  Plan follow-up in 6 months.  Pixie Casino,  MD, [facility] Attending Cardiologist [place] 04/03/2016, 1:35 PM

## 2016-05-06 ENCOUNTER — Other Ambulatory Visit: Payer: Self-pay | Admitting: Internal Medicine

## 2016-05-06 NOTE — Telephone Encounter (Signed)
New Message   *STAT* If patient is at the pharmacy, call can be transferred to refill team.   1. Which medications need to be refilled? (please list name of each medication and dose if known) Renexa 500mg   2. Which pharmacy/location (including street and city if local pharmacy) is medication to be sent to? [place] [address]  3. Do they need a 30 day or 90 day supply? [place]

## 2016-05-07 MED ORDER — RANOLAZINE ER 500 MG PO TB12: 1000.0000 mg | ORAL_TABLET | Freq: Two times a day (BID) | ORAL | 3 refills | Status: DC

## 2016-05-07 NOTE — Telephone Encounter (Signed)
Rx(s) sent to pharmacy electronically.  

## 2016-06-04 ENCOUNTER — Other Ambulatory Visit: Payer: Self-pay | Admitting: Internal Medicine

## 2016-06-16 ENCOUNTER — Telehealth: Payer: Self-pay | Admitting: Internal Medicine

## 2016-06-16 MED ORDER — PRASUGREL HCL 10 MG PO TABS: 10.0000 mg | ORAL_TABLET | Freq: Every day | ORAL | 5 refills | Status: DC

## 2016-06-16 NOTE — Telephone Encounter (Signed)
New message  Thomas Walsh call requesting to speak with RN. She states she will speak with RN when she calls back. Please call back to discuss

## 2016-06-16 NOTE — Telephone Encounter (Signed)
Spoke with pt wife pt just needs refill, refill sent

## 2016-06-23 ENCOUNTER — Encounter: Payer: Self-pay | Admitting: Internal Medicine

## 2016-07-08 ENCOUNTER — Other Ambulatory Visit: Payer: Self-pay | Admitting: Internal Medicine

## 2016-07-08 MED ORDER — ATORVASTATIN CALCIUM 40 MG PO TABS: 40.0000 mg | ORAL_TABLET | Freq: Every day | ORAL | 1 refills | Status: DC

## 2016-07-08 NOTE — Telephone Encounter (Signed)
°*  STAT* If patient is at the pharmacy, call can be transferred to refill team.   1. Which medications need to be refilled? (please list name of each medication and dose if known) atorvastatin, 40 mg  2. Which pharmacy/location (including street and city if local pharmacy) is medication to be sent to?[place], [place] [address]  3. Do they need a 30 day or 90 day supply? 90  patient is out of medication

## 2016-07-08 NOTE — Telephone Encounter (Signed)
Rx(s) sent to pharmacy electronically.  

## 2016-07-21 ENCOUNTER — Other Ambulatory Visit: Payer: Self-pay | Admitting: *Deleted

## 2016-07-22 MED ORDER — NITROGLYCERIN 0.4 MG SL SUBL: 0.4000 mg | SUBLINGUAL_TABLET | SUBLINGUAL | 0 refills | Status: DC | PRN

## 2016-07-29 ENCOUNTER — Telehealth: Payer: Self-pay | Admitting: Internal Medicine

## 2016-07-29 ENCOUNTER — Telehealth: Payer: Self-pay | Admitting: Diagnostic Radiology

## 2016-07-29 MED ORDER — OMEPRAZOLE 40 MG PO CPDR: 40.0000 mg | DELAYED_RELEASE_CAPSULE | Freq: Two times a day (BID) | ORAL | 3 refills | Status: DC

## 2016-07-29 NOTE — Telephone Encounter (Signed)
Wrong provider

## 2016-07-29 NOTE — Telephone Encounter (Signed)
°*  STAT* If patient is at the pharmacy, call can be transferred to refill team.   Which medications need to be refilled? (please list name of each medication and dose if known) Omeprazole  2. Which pharmacy/location (including street and city if local pharmacy) is medication to be sent to?[phone]  3. Do they need a 30 day or 90 day supply? 180 and refills

## 2016-07-29 NOTE — Telephone Encounter (Signed)
Refill sent to the pharmacy electronically.  

## 2016-08-25 ENCOUNTER — Other Ambulatory Visit: Payer: Self-pay | Admitting: Internal Medicine

## 2016-08-26 NOTE — Telephone Encounter (Signed)
Rx has been sent to the pharmacy electronically. ° °

## 2016-09-23 ENCOUNTER — Other Ambulatory Visit: Payer: Self-pay | Admitting: Internal Medicine

## 2016-09-24 ENCOUNTER — Other Ambulatory Visit: Payer: Self-pay | Admitting: Internal Medicine

## 2016-09-24 NOTE — Telephone Encounter (Signed)
New message    *STAT* If patient is at the pharmacy, call can be transferred to refill team.   1. Which medications need to be refilled? (please list name of each medication and dose if known) isosorbide mononitrate (IMDUR) 60 MG 24 hr tablet  2. Which pharmacy/location (including street and city if local pharmacy) is medication to be sent to? [place] [place] [facility]  3. Do they need a 30 day or 90 day supply? 30 day   Appt schedule for July 9th with Dr. Debara Pickett

## 2016-09-25 NOTE — Telephone Encounter (Signed)
Medication Detail    Disp Refills Start End   isosorbide mononitrate (IMDUR) 60 MG 24 hr tablet 30 tablet 6 09/23/2016    Sig - Route: Take 1 tablet (60 mg total) by mouth daily. Please call to schedule follow up appointment, thanks! - Oral   Notes to Pharmacy: Please consider 90 day supplies to promote better adherence   E-Prescribing Status: Receipt confirmed by pharmacy (09/23/2016 2:46 PM EDT)   Pharmacy   [place], [place]

## 2016-10-05 ENCOUNTER — Encounter: Payer: Self-pay | Admitting: Internal Medicine

## 2016-10-05 ENCOUNTER — Ambulatory Visit (INDEPENDENT_AMBULATORY_CARE_PROVIDER_SITE_OTHER): Payer: BLUE CROSS/BLUE SHIELD | Admitting: Internal Medicine

## 2016-10-05 VITALS — BP 104/70 | HR 73 | Ht 70.0 in | Wt 270.0 lb

## 2016-10-05 DIAGNOSIS — Z950 Presence of cardiac pacemaker: Secondary | ICD-10-CM | POA: Diagnosis not present

## 2016-10-05 DIAGNOSIS — R0602 Shortness of breath: Secondary | ICD-10-CM | POA: Diagnosis not present

## 2016-10-05 DIAGNOSIS — I25119 Atherosclerotic heart disease of native coronary artery with unspecified angina pectoris: Secondary | ICD-10-CM | POA: Diagnosis not present

## 2016-10-05 DIAGNOSIS — Q245 Malformation of coronary vessels: Secondary | ICD-10-CM | POA: Diagnosis not present

## 2016-10-05 MED ORDER — ISOSORBIDE MONONITRATE ER 60 MG PO TB24: ORAL_TABLET | ORAL | 3 refills | Status: DC

## 2016-10-05 NOTE — Progress Notes (Signed)
OFFICE NOTE  Chief Complaint:  Chest pain  Primary Care Physician: System, Pcp Not In  HPI:  Thomas Walsh is a 61 y.o. male who was referred from another patient of mine who is his brother-in-law. His primary physician is [name], MD and he sees Dr. Marcello Moores of [facility] in [place], [place]. Thomas Walsh has a very complex cardiac history. He was noted to have had a left heart catheterization at [facility] in 2000 for profound symptomatic bradycardia with heart rate of 30s related to timolol eyedrops, but was found to have no obstructive coronary disease at the time. There is a family history of premature coronary artery disease Apparently in late 2015 he saw Dr. Marcello Moores in the office because of chest discomfort. He had described that as "poking, pressure and squeezing which awoke him from sleep". He was scheduled to have a stress test however during the EKG portion of the stress test there were changes suggestive of ischemia and the test was discontinued. Interpretation of that nuclear stress test was "myocardial perfusion imaging is positive for inducible ischemia. There are regions of reversible ischemia located in the inferior wall of the heart. Overall left ventricular systolic function is normal without regional wall motion abnormalities noted above." He was then going to be scheduled for elective heart catheterization however he presented to the emergency department due to continued chest pain. He underwent left heart catheterization on 03/07/2014. 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 After the procedure however he continued to have chest pain symptoms. He was placed on aspirin and Effient and started on low-dose calcium channel blocker which caused  low blood pressure and was discontinued. Cardiac markers were noted to be mildly elevated the next day at 0.41 troponin which was previously negative on 03/06/2014. LDL at that time was 52. He was then admitted to [facility] in [place], [place] for chest pain in January 2016. He also complained of palpitations and was found to have an ectopic atrial rhythm with normal ventricular response. He claims that he had had significant tachycardia and was thought to have had an SVT. He reports he subsequently presented and required treatment for an SVT on another occasion when he presented to the hospital around [place], [place]. He said that he received adenosine 6 mg and then 12 mg and ultimately converted to sinus. He then was reportedly referred to Dr. Nyra Market in [facility] who scheduled him for ablation. Apparently afterwards he reports that the ablation was not totally successful however a pacemaker was required to be placed - this leads me to believe that there may have been AV nodal damage at the time. 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 The pacemaker is by his report a Medtronic device and he does have a home monitoring. He said most recently he was 94% paced. He has had progressive antianginal regimens including Ranexa and topical nitrate. 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 He underwent his last cardiac catheterization in September 2017 which demonstrated only 30% stenosis at the distal edge of the mid LAD stent. FFR was performed which was 0.89. Apparently he was continuing to have recurrent arrhythmias. Thomas Walsh also has had an opinion from a cardiologist at  [facility]. I cannot find records in care everywhere to support this however he does have some paperwork indicating he had an appointment. He does not feel that that appointment was helpful. He did bring a CD with images of his echo and cath which I will  review. His main complaint today is continued chest pain.   04/03/2016  Thomas Walsh returns today for follow-up. I was able to review his extensive records and understand he is history including placement of a Medtronic advise a MRI safe pacemaker on 10/18/2014 for sick sinus syndrome. Subsequently he developed some PAF but reportedly has a very low burden of this. 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 Recently he saw Dr. Lovena Le and was not felt to be in heart block or to be pacer dependent. Because of his dyspnea he was scheduled for a cardiopulmonary exercise test. Thomas Walsh did undergo that study which indicated no significant circulatory limitation to exercise. His peak V02 was 103%. Pulmonary function was within normal limits. It was felt that likely his symptoms were related to obesity. These are preliminary results pending a cardiology over read, but I agree with those findings. He says that he said much less angina since adjusting his medications. He is now on imdur 60 mg daily as compared to his nitroglycerin patch. Although he has some mild headaches he says that he has not needed any short acting nitroglycerin in the last 3 weeks. He is also on an increased dose of metoprolol, namely 100 mg daily. He is generally pleased with his improvement in symptoms.  10/05/2016  Thomas Walsh was seen today in follow-up. He reports that over the past several weeks she's had more chest pain. He describes this as sharp and knifelike in the anterior left chest. It's not necessarily worse with exertion, in fact it's worse at night when he lays down after going to sleep. He's been more fatigued and gets short of breath somewhat easier. He had a stress test recently which was nonischemic (this was a cardiopulmonary exercise test, but was noted to be some maximal. It was felt that his limitation exercise was due to obesity. He  reports responsiveness to nitrates recently and says that he ran out of his isosorbide which caused him more chest pain but that it improved when he restarted it, however despite that he continues to have pain and requires short acting nitroglycerin for some relief. He says that some of his symptoms feel like his angina in the past. He's been established in our pacemaker clinic and had a check in December with Dr. Lovena Le however wishes to switch his follow-up to [facility].  PMHx:  Past Medical History:  Diagnosis Date  . Asthma   . 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 Coronary artery disease involving native coronary artery of native heart with unstable angina pectoris ([place]) 02/28/2016  . Essential hypertension 02/28/2016  . GERD (gastroesophageal reflux disease)   . HLD (hyperlipidemia)   . 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 OSA treated with BiPAP   . PAF (paroxysmal atrial fibrillation) ([place]) 02/28/2016  . PCO (posterior capsular opacification)   . Pseudophakia of both eyes   . S/P bilateral cataract extraction 11/21/2013  . S/P placement of cardiac pacemaker 02/28/2016   Medtronic  . S/P primary angioplasty with coronary stent 02/28/2016  . Secondary corneal edema of left eye   . Status post  corneal transplant   . Thyroid disease     Past Surgical History:  Procedure Laterality Date  . APPENDECTOMY    . AQUEOUS SHUNT EXTRAOCULAR Left 09/18/2013   Elenore Rota L. Pennie Banter, MD [facility]  . CARDIAC CATHETERIZATION  03/07/2014  . CATARACT EXTRACTION Bilateral   . CATARACT EXTRACTION W/ INTRAOCULAR LENS IMPLANT Left 01/2012   McCuen, MD  . CATARACT EXTRACTION W/ INTRAOCULAR LENS IMPLANT Right 2003   [facility]  . CORNEAL TRANSPLANT Left 01/17/2015   Keratoplasty, Endothelial. Nelma Rothman, MD [place]   . DESCEMETS STRIPPING AUTOMATED ENDOTHELIAL KERATOPLASTY Left 04/2012   Marilynne Halsted, MD;  . GLAUCOMA SURGERY Left 09/18/2013   [place] BG-101 implant with  cornea graft  . PACEMAKER INSERTION  09/2014   Done at [facility].   [place] Levo REINFORCEMENT Left 09/18/2013   with graft, Edson Snowball, MD, [facility]  . WISDOM TOOTH EXTRACTION      FAMHx:  Family History  Problem Relation Age of Onset  . Colon cancer Mother   . Hypertension Mother   . Kidney disease Father   . CAD Father   . Stroke Father   . Spina bifida Sister   . Thyroid disease Sister   . Diabetes Brother   . Retinal detachment Brother     SOCHx:   reports that he has never smoked. He has never used smokeless tobacco. He reports that he drinks about 0.6 oz of alcohol per week . He reports that he does not use drugs.  ALLERGIES:  Allergies  Allergen Reactions  . Timolol Anaphylaxis and Other (See Comments)    Cardiac stand still  Stops heart  Pt reports hx severe bradycardia after long term use of timolol Cardiac stand still  Stops heart   . Other Other (See Comments)    Beta Blockers-Timolol caused black outs    ROS: Pertinent items noted in HPI and remainder of comprehensive ROS otherwise negative.  HOME MEDS: Current Outpatient Prescriptions on File Prior to Visit  Medication Sig Dispense Refill  . aspirin 325 MG tablet Take 325 mg by mouth daily.    Marland Kitchen atorvastatin (LIPITOR) 40 MG tablet Take 1 tablet (40 mg total) by mouth daily. 90 tablet 1  . cloNIDine (CATAPRES) 0.1 MG tablet Pt takes only if blood pressure goes over 150  2  . ezetimibe (ZETIA) 10 MG tablet Take 10 mg by mouth daily.    Marland Kitchen levothyroxine (SYNTHROID, LEVOTHROID) 100 MCG tablet Take 100 mcg by mouth daily before breakfast.    . liraglutide (VICTOZA) 18 MG/3ML SOPN Inject 18 mg into the skin daily.    Marland Kitchen lisinopril (PRINIVIL,ZESTRIL) 10 MG tablet Take 10 mg by mouth daily.    . metoprolol succinate (TOPROL-XL) 50 MG 24 hr tablet Take 1 tablet (50 mg total) by mouth 2 (two) times daily. 60 tablet 12  . nitroGLYCERIN (NITROSTAT) 0.4 MG SL tablet DISSOLVE ONE TABLET UNDER THE TONGUE EVERY 5 MINUTES  AS NEEDED FOR CHEST PAIN.  DO NOT EXCEED A TOTAL OF 3 DOSES IN 15 MINUTES NOW 25 tablet 0  . omeprazole (PRILOSEC) 40 MG capsule Take 1 capsule (40 mg total) by mouth 2 (two) times daily. 180 capsule 3  . prasugrel (EFFIENT) 10 MG TABS tablet Take 1 tablet (10 mg total) by mouth daily. 30 tablet 5  . ranolazine (RANEXA) 500 MG 12 hr tablet Take 2 tablets (1,000 mg total) by mouth 2 (two) times daily. 180 tablet 3   No current facility-administered medications  on file prior to visit.     LABS/IMAGING: No results found for this or any previous visit (from the past 48 hour(s)). No results found.  WEIGHTS: Wt Readings from Last 3 Encounters:  10/05/16 270 lb (122.5 kg)  04/03/16 266 lb 12.8 oz (121 kg)  03/10/16 269 lb 9.6 oz (122.3 kg)    VITALS: BP 104/70   Pulse 73   Ht 5\' 10"  (1.778 m)   Wt 270 lb (122.5 kg)   BMI 38.74 kg/m   EXAM: General appearance: alert and no distress Neck: no carotid bruit, no JVD and thyroid not enlarged, symmetric, no tenderness/mass/nodules Lungs: clear to auscultation bilaterally Heart: regular rate and rhythm, S1, S2 normal, no murmur, click, rub or gallop Abdomen: soft, non-tender; bowel sounds normal; no masses,  no organomegaly and obese Extremities: extremities normal, atraumatic, no cyanosis or edema Pulses: 2+ and symmetric Skin: Skin color, texture, turgor normal. No rashes or lesions Neurologic: Grossly normal Psych: Mild discomfort  EKG: Sinus rhythm at 73, voltage criteria for LVH, nonspecific T-wave changes-personally reviewed  ASSESSMENT: 1. Coronary artery disease with persistent angina 2. 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 History of SVT with ablation and need for subsequent PPM 4. S/p PPM (Medtronic) 5. Dyslipidemia 6. Hypertension 7. OSA on BIPAP  PLAN: 1.   Thomas Walsh is describing some persistent angina. He has chest pain which seems to be relieved with short acting nitroglycerin  after about 15 minutes. It seems to be at rest and mostly at night after a days work. He says he gets exhausted and feels like his heart is "cramped" by the end of the day. He reports compliance with BIPAP. Despite this he is fatigued and does not get a restful night sleep. He noted improvement with long-acting nitrate will increase his Imdur to 90 mg daily. I advised him to try this for couple of weeks and if his symptoms are not necessarily improved, he may need repeat cardiac catheterization. Per his request we'll remove his pacemaker device checks to the [employer]. He is overdue for check of that which we'll arrange with Dr. Sallyanne Kuster.  Follow-up with me one month.  Pixie Casino, MD, [facility] Attending Cardiologist [place] C Kendan Cornforth 10/05/2016, 9:52 AM

## 2016-10-05 NOTE — Patient Instructions (Addendum)
Your physician has recommended you make the following change in your medication:  -- Increase IMDUR to 90mg  total daily - 60mg  in the morning, 30mg  in the evening -- If you are not feeling better in a few weeks, please call our office  Your physician recommends that you schedule a follow-up appointment for Thomas Walsh with Thomas Walsh - first available   Your physician wants you to follow-up in: ONE MONTH with Thomas Walsh.

## 2016-10-12 ENCOUNTER — Encounter: Payer: Self-pay | Admitting: Internal Medicine

## 2016-10-13 ENCOUNTER — Telehealth: Payer: Self-pay | Admitting: Internal Medicine

## 2016-10-13 ENCOUNTER — Encounter: Payer: Self-pay | Admitting: *Deleted

## 2016-10-13 DIAGNOSIS — R079 Chest pain, unspecified: Secondary | ICD-10-CM

## 2016-10-13 DIAGNOSIS — I25119 Atherosclerotic heart disease of native coronary artery with unspecified angina pectoris: Secondary | ICD-10-CM

## 2016-10-13 NOTE — Telephone Encounter (Signed)
Left message for patient to call back (if needed) (had previously reviewed instructions w/patient) Instructions for cath sent to patient via MyChart

## 2016-10-13 NOTE — Telephone Encounter (Signed)
Patient called per MD request to set up [place] Patient wishes to have procedure done Monday or Tuesday  He would have labs done day of procedure - lives out of town  Patient scheduled for cath with Dr. [place]  Arrive 0800 for 1030 cath

## 2016-10-19 ENCOUNTER — Telehealth: Payer: Self-pay

## 2016-10-19 NOTE — Telephone Encounter (Signed)
Patient contacted pre-catheterization at [facility] scheduled for: 10/20/2016 @ 1030 Verified arrival time and place:  NT @ 0800 Confirmed AM meds to be taken pre-cath with sip of water:  Notified to take ASA/Effient prior to arrival.  Hold Victoza.  Pt indicates understanding. Confirmed patient has responsible person to drive home post procedure and observe patient for 24 hours:  wife Addl concerns:  None noted

## 2016-10-20 ENCOUNTER — Telehealth: Payer: Self-pay | Admitting: Internal Medicine

## 2016-10-20 ENCOUNTER — Encounter (HOSPITAL_COMMUNITY)
Admission: RE | Disposition: A | Discharge status: Home / Self Care | Payer: Self-pay | Source: Ambulatory Visit | Attending: Cardiology

## 2016-10-20 ENCOUNTER — Ambulatory Visit (HOSPITAL_COMMUNITY)
Admission: RE | Admit: 2016-10-20 | Discharge: 2016-10-20 | Disposition: A | Discharge status: Home / Self Care | Payer: BLUE CROSS/BLUE SHIELD | Source: Ambulatory Visit | Attending: Cardiology | Admitting: Cardiology

## 2016-10-20 ENCOUNTER — Encounter (HOSPITAL_COMMUNITY): Payer: Self-pay | Admitting: Cardiology

## 2016-10-20 DIAGNOSIS — Z947 Corneal transplant status: Secondary | ICD-10-CM | POA: Diagnosis not present

## 2016-10-20 DIAGNOSIS — Z6838 Body mass index (BMI) 38.0-38.9, adult: Secondary | ICD-10-CM | POA: Diagnosis not present

## 2016-10-20 DIAGNOSIS — Z9861 Coronary angioplasty status: Secondary | ICD-10-CM

## 2016-10-20 DIAGNOSIS — I208 Other forms of angina pectoris: Secondary | ICD-10-CM | POA: Diagnosis present

## 2016-10-20 DIAGNOSIS — I2510 Atherosclerotic heart disease of native coronary artery without angina pectoris: Secondary | ICD-10-CM | POA: Diagnosis present

## 2016-10-20 DIAGNOSIS — K219 Gastro-esophageal reflux disease without esophagitis: Secondary | ICD-10-CM | POA: Diagnosis not present

## 2016-10-20 DIAGNOSIS — E785 Hyperlipidemia, unspecified: Secondary | ICD-10-CM | POA: Diagnosis not present

## 2016-10-20 DIAGNOSIS — Q245 Malformation of coronary vessels: Secondary | ICD-10-CM | POA: Diagnosis not present

## 2016-10-20 DIAGNOSIS — G8929 Other chronic pain: Secondary | ICD-10-CM | POA: Diagnosis present

## 2016-10-20 DIAGNOSIS — Z955 Presence of coronary angioplasty implant and graft: Secondary | ICD-10-CM | POA: Insufficient documentation

## 2016-10-20 DIAGNOSIS — I25118 Atherosclerotic heart disease of native coronary artery with other forms of angina pectoris: Secondary | ICD-10-CM | POA: Insufficient documentation

## 2016-10-20 DIAGNOSIS — I10 Essential (primary) hypertension: Secondary | ICD-10-CM | POA: Insufficient documentation

## 2016-10-20 DIAGNOSIS — Z7982 Long term (current) use of aspirin: Secondary | ICD-10-CM | POA: Diagnosis not present

## 2016-10-20 DIAGNOSIS — R079 Chest pain, unspecified: Secondary | ICD-10-CM

## 2016-10-20 DIAGNOSIS — G4733 Obstructive sleep apnea (adult) (pediatric): Secondary | ICD-10-CM | POA: Insufficient documentation

## 2016-10-20 DIAGNOSIS — E669 Obesity, unspecified: Secondary | ICD-10-CM | POA: Insufficient documentation

## 2016-10-20 DIAGNOSIS — Z8249 Family history of ischemic heart disease and other diseases of the circulatory system: Secondary | ICD-10-CM | POA: Insufficient documentation

## 2016-10-20 DIAGNOSIS — J45909 Unspecified asthma, uncomplicated: Secondary | ICD-10-CM | POA: Diagnosis not present

## 2016-10-20 DIAGNOSIS — Z950 Presence of cardiac pacemaker: Secondary | ICD-10-CM | POA: Diagnosis not present

## 2016-10-20 HISTORY — PX: LEFT HEART CATH AND CORONARY ANGIOGRAPHY: CATH118249

## 2016-10-20 LAB — CBC
HCT: 49.1 % (ref 39.0–52.0)
HEMOGLOBIN: 17.5 g/dL — AB (ref 13.0–17.0)
MCH: 29.5 pg (ref 26.0–34.0)
MCHC: 35.6 g/dL (ref 30.0–36.0)
MCV: 82.7 fL (ref 78.0–100.0)
Platelets: 156 10*3/uL (ref 150–400)
RBC: 5.94 MIL/uL — AB (ref 4.22–5.81)
RDW: 14.6 % (ref 11.5–15.5)
WBC: 6.2 10*3/uL (ref 4.0–10.5)

## 2016-10-20 LAB — BASIC METABOLIC PANEL
Anion gap: 9 (ref 5–15)
BUN: 12 mg/dL (ref 6–20)
CHLORIDE: 104 mmol/L (ref 101–111)
CO2: 25 mmol/L (ref 22–32)
CREATININE: 1.19 mg/dL (ref 0.61–1.24)
Calcium: 8.6 mg/dL — ABNORMAL LOW (ref 8.9–10.3)
GFR calc Af Amer: >60 mL/min (ref >60)
GFR calc non Af Amer: >60 mL/min (ref >60)
GLUCOSE: 200 mg/dL — AB (ref 65–99)
Potassium: 4.1 mmol/L (ref 3.5–5.1)
SODIUM: 138 mmol/L (ref 135–145)

## 2016-10-20 LAB — PROTIME-INR
INR: 1
PROTHROMBIN TIME: 13.2 s (ref 11.4–15.2)

## 2016-10-20 SURGERY — LEFT HEART CATH AND CORONARY ANGIOGRAPHY
Anesthesia: LOCAL

## 2016-10-20 MED ORDER — VERAPAMIL HCL 2.5 MG/ML IV SOLN
INTRAVENOUS | Status: DC | PRN
  Administered 2016-10-20: 11:00:00 via INTRA_ARTERIAL

## 2016-10-20 MED ORDER — SODIUM CHLORIDE 0.9 % IV SOLN: 250.0000 mL | INTRAVENOUS | Status: DC | PRN

## 2016-10-20 MED ORDER — IOPAMIDOL (ISOVUE-370) INJECTION 76%
INTRAVENOUS | Status: AC
  Filled 2016-10-20: qty 100

## 2016-10-20 MED ORDER — PRASUGREL HCL 10 MG PO TABS: 10.0000 mg | ORAL_TABLET | ORAL | Status: DC

## 2016-10-20 MED ORDER — HEPARIN SODIUM (PORCINE) 1000 UNIT/ML IJ SOLN
INTRAMUSCULAR | Status: AC
  Filled 2016-10-20: qty 1

## 2016-10-20 MED ORDER — HEPARIN (PORCINE) IN NACL 2-0.9 UNIT/ML-% IJ SOLN
INTRAMUSCULAR | Status: AC
  Filled 2016-10-20: qty 1000

## 2016-10-20 MED ORDER — HEPARIN (PORCINE) IN NACL 2-0.9 UNIT/ML-% IJ SOLN
INTRAMUSCULAR | Status: AC | PRN
  Administered 2016-10-20: 1000 mL

## 2016-10-20 MED ORDER — FENTANYL CITRATE (PF) 100 MCG/2ML IJ SOLN
INTRAMUSCULAR | Status: AC
  Filled 2016-10-20: qty 2

## 2016-10-20 MED ORDER — IOPAMIDOL (ISOVUE-370) INJECTION 76%
INTRAVENOUS | Status: DC | PRN
  Administered 2016-10-20: 100 mL via INTRA_ARTERIAL

## 2016-10-20 MED ORDER — SODIUM CHLORIDE 0.9 % WEIGHT BASED INFUSION
3.0000 mL/kg/h | INTRAVENOUS | Status: AC
  Administered 2016-10-20: 3 mL/kg/h via INTRAVENOUS

## 2016-10-20 MED ORDER — SODIUM CHLORIDE 0.9% FLUSH: 3.0000 mL | INTRAVENOUS | Status: DC | PRN

## 2016-10-20 MED ORDER — VERAPAMIL HCL 2.5 MG/ML IV SOLN
INTRAVENOUS | Status: AC
  Filled 2016-10-20: qty 2

## 2016-10-20 MED ORDER — LIDOCAINE HCL (PF) 1 % IJ SOLN
INTRAMUSCULAR | Status: DC | PRN
  Administered 2016-10-20: 2 mL

## 2016-10-20 MED ORDER — ASPIRIN 81 MG PO CHEW: 81.0000 mg | CHEWABLE_TABLET | ORAL | Status: DC

## 2016-10-20 MED ORDER — MIDAZOLAM HCL 2 MG/2ML IJ SOLN
INTRAMUSCULAR | Status: DC | PRN
  Administered 2016-10-20: 2 mg via INTRAVENOUS

## 2016-10-20 MED ORDER — SODIUM CHLORIDE 0.9% FLUSH: 3.0000 mL | Freq: Two times a day (BID) | INTRAVENOUS | Status: DC

## 2016-10-20 MED ORDER — HEPARIN SODIUM (PORCINE) 1000 UNIT/ML IJ SOLN
INTRAMUSCULAR | Status: DC | PRN
  Administered 2016-10-20: 6000 [IU] via INTRAVENOUS

## 2016-10-20 MED ORDER — FENTANYL CITRATE (PF) 100 MCG/2ML IJ SOLN
INTRAMUSCULAR | Status: DC | PRN
  Administered 2016-10-20: 25 ug via INTRAVENOUS

## 2016-10-20 MED ORDER — SODIUM CHLORIDE 0.9 % WEIGHT BASED INFUSION: 1.0000 mL/kg/h | INTRAVENOUS | Status: DC

## 2016-10-20 MED ORDER — SODIUM CHLORIDE 0.9 % WEIGHT BASED INFUSION: 1.0000 mL/kg/h | INTRAVENOUS | Status: AC

## 2016-10-20 MED ORDER — MIDAZOLAM HCL 2 MG/2ML IJ SOLN
INTRAMUSCULAR | Status: AC
  Filled 2016-10-20: qty 2

## 2016-10-20 MED ORDER — LIRAGLUTIDE 18 MG/3ML ~~LOC~~ SOPN: 1.8000 mg | PEN_INJECTOR | Freq: Every day | SUBCUTANEOUS | Status: DC

## 2016-10-20 MED ORDER — LIDOCAINE HCL (PF) 1 % IJ SOLN
INTRAMUSCULAR | Status: AC
  Filled 2016-10-20: qty 30

## 2016-10-20 SURGICAL SUPPLY — 10 items

## 2016-10-20 NOTE — H&P (View-Only) (Signed)
OFFICE NOTE  Chief Complaint:  Chest pain  Primary Care Physician: System, Pcp Not In  HPI:  Thomas Walsh is a 61 y.o. male who was referred from another patient of mine who is his brother-in-law. His primary physician is Thomas Paradise, MD and he sees Thomas Walsh of [facility] in [place], [place]. Thomas Walsh has a very complex cardiac history. He was noted to have had a left heart catheterization at [facility] in 2000 for profound symptomatic bradycardia with heart rate of 30s related to timolol eyedrops, but was found to have no obstructive coronary disease at the time. There is a family history of premature coronary artery disease Apparently in late 2015 he saw Thomas Walsh in the office because of chest discomfort. He had described that as "poking, pressure and squeezing which awoke him from sleep". He was scheduled to have a stress test however during the EKG portion of the stress test there were changes suggestive of ischemia and the test was discontinued. Interpretation of that nuclear stress test was "myocardial perfusion imaging is positive for inducible ischemia. There are regions of reversible ischemia located in the inferior wall of the heart. Overall left ventricular systolic function is normal without regional wall motion abnormalities noted above." He was then going to be scheduled for elective heart catheterization however he presented to the emergency department due to continued chest pain. He underwent left heart catheterization on 03/07/2014. 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 After the procedure however he continued to have chest pain symptoms. He was placed on aspirin and Effient and started on low-dose calcium channel blocker which caused  low blood pressure and was discontinued. Cardiac markers were noted to be mildly elevated the next day at 0.41 troponin which was previously negative on 03/06/2014. LDL at that time was 52. He was then admitted to [facility] in [place], [place] for chest pain in January 2016. He also complained of palpitations and was found to have an ectopic atrial rhythm with normal ventricular response. He claims that he had had significant tachycardia and was thought to have had an SVT. He reports he subsequently presented and required treatment for an SVT on another occasion when he presented to the hospital around [place], [place]. He said that he received adenosine 6 mg and then 12 mg and ultimately converted to sinus. He then was reportedly referred to Thomas Walsh in [facility] who scheduled him for ablation. Apparently afterwards he reports that the ablation was not totally successful however a pacemaker was required to be placed - this leads me to believe that there may have been AV nodal damage at the time. 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 The pacemaker is by his report a Medtronic device and he does have a home monitoring. He said most recently he was 94% paced. He has had progressive antianginal regimens including Ranexa and topical nitrate. 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 He underwent his last cardiac catheterization in September 2017 which demonstrated only 30% stenosis at the distal edge of the mid LAD stent. FFR was performed which was 0.89. Apparently he was continuing to have recurrent arrhythmias. Thomas Walsh also has had an opinion from a cardiologist at  [facility]. I cannot find records in care everywhere to support this however he does have some paperwork indicating he had an appointment. He does not feel that that appointment was helpful. He did bring a CD with images of his echo and cath which I will  review. His main complaint today is continued chest pain.   04/03/2016  Thomas Walsh returns today for follow-up. I was able to review his extensive records and understand he is history including placement of a Medtronic advise a MRI safe pacemaker on 10/18/2014 for sick sinus syndrome. Subsequently he developed some PAF but reportedly has a very low burden of this. 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 Recently he saw Thomas Walsh and was not felt to be in heart block or to be pacer dependent. Because of his dyspnea he was scheduled for a cardiopulmonary exercise test. Thomas Walsh did undergo that study which indicated no significant circulatory limitation to exercise. His peak V02 was 103%. Pulmonary function was within normal limits. It was felt that likely his symptoms were related to obesity. These are preliminary results pending a cardiology over read, but I agree with those findings. He says that he said much less angina since adjusting his medications. He is now on imdur 60 mg daily as compared to his nitroglycerin patch. Although he has some mild headaches he says that he has not needed any short acting nitroglycerin in the last 3 weeks. He is also on an increased dose of metoprolol, namely 100 mg daily. He is generally pleased with his improvement in symptoms.  10/05/2016  Thomas Walsh was seen today in follow-up. He reports that over the past several weeks she's had more chest pain. He describes this as sharp and knifelike in the anterior left chest. It's not necessarily worse with exertion, in fact it's worse at night when he lays down after going to sleep. He's been more fatigued and gets short of breath somewhat easier. He had a stress test recently which was nonischemic (this was a cardiopulmonary exercise test, but was noted to be some maximal. It was felt that his limitation exercise was due to obesity. He  reports responsiveness to nitrates recently and says that he ran out of his isosorbide which caused him more chest pain but that it improved when he restarted it, however despite that he continues to have pain and requires short acting nitroglycerin for some relief. He says that some of his symptoms feel like his angina in the past. He's been established in our pacemaker clinic and had a check in December with Thomas Walsh however wishes to switch his follow-up to [facility].  PMHx:  Past Medical History:  Diagnosis Date  . Asthma   . 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 Coronary artery disease involving native coronary artery of native heart with unstable angina pectoris ([place]) 02/28/2016  . Essential hypertension 02/28/2016  . GERD (gastroesophageal reflux disease)   . HLD (hyperlipidemia)   . 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 OSA treated with BiPAP   . PAF (paroxysmal atrial fibrillation) ([place]) 02/28/2016  . PCO (posterior capsular opacification)   . Pseudophakia of both eyes   . S/P bilateral cataract extraction 11/21/2013  . S/P placement of cardiac pacemaker 02/28/2016   Medtronic  . S/P primary angioplasty with coronary stent 02/28/2016  . Secondary corneal edema of left eye   . Status post  corneal transplant   . Thyroid disease     Past Surgical History:  Procedure Laterality Date  . APPENDECTOMY    . AQUEOUS SHUNT EXTRAOCULAR Left 09/18/2013   Elenore Rota L. Pennie Banter, MD [facility]  . CARDIAC CATHETERIZATION  03/07/2014  . CATARACT EXTRACTION Bilateral   . CATARACT EXTRACTION W/ INTRAOCULAR LENS IMPLANT Left 01/2012   McCuen, MD  . CATARACT EXTRACTION W/ INTRAOCULAR LENS IMPLANT Right 2003   [facility]  . CORNEAL TRANSPLANT Left 01/17/2015   Keratoplasty, Endothelial. Nelma Rothman, MD [place]   . DESCEMETS STRIPPING AUTOMATED ENDOTHELIAL KERATOPLASTY Left 04/2012   Marilynne Halsted, MD;  . GLAUCOMA SURGERY Left 09/18/2013   [place] BG-[id number] implant with  cornea graft  . PACEMAKER INSERTION  09/2014   Done at [facility].   [place] Levo REINFORCEMENT Left 09/18/2013   with graft, Edson Snowball, MD, [facility]  . WISDOM TOOTH EXTRACTION      FAMHx:  Family History  Problem Relation Age of Onset  . Colon cancer Mother   . Hypertension Mother   . Kidney disease Father   . CAD Father   . Stroke Father   . Spina bifida Sister   . Thyroid disease Sister   . Diabetes Brother   . Retinal detachment Brother     SOCHx:   reports that he has never smoked. He has never used smokeless tobacco. He reports that he drinks about 0.6 oz of alcohol per week . He reports that he does not use drugs.  ALLERGIES:  Allergies  Allergen Reactions  . Timolol Anaphylaxis and Other (See Comments)    Cardiac stand still  Stops heart  Pt reports hx severe bradycardia after long term use of timolol Cardiac stand still  Stops heart   . Other Other (See Comments)    Beta Blockers-Timolol caused black outs    ROS: Pertinent items noted in HPI and remainder of comprehensive ROS otherwise negative.  HOME MEDS: Current Outpatient Prescriptions on File Prior to Visit  Medication Sig Dispense Refill  . aspirin 325 MG tablet Take 325 mg by mouth daily.    Marland Kitchen atorvastatin (LIPITOR) 40 MG tablet Take 1 tablet (40 mg total) by mouth daily. 90 tablet 1  . cloNIDine (CATAPRES) 0.1 MG tablet Pt takes only if blood pressure goes over 150  2  . ezetimibe (ZETIA) 10 MG tablet Take 10 mg by mouth daily.    Marland Kitchen levothyroxine (SYNTHROID, LEVOTHROID) 100 MCG tablet Take 100 mcg by mouth daily before breakfast.    . liraglutide (VICTOZA) 18 MG/3ML SOPN Inject 18 mg into the skin daily.    Marland Kitchen lisinopril (PRINIVIL,ZESTRIL) 10 MG tablet Take 10 mg by mouth daily.    . metoprolol succinate (TOPROL-XL) 50 MG 24 hr tablet Take 1 tablet (50 mg total) by mouth 2 (two) times daily. 60 tablet 12  . nitroGLYCERIN (NITROSTAT) 0.4 MG SL tablet DISSOLVE ONE TABLET UNDER THE TONGUE EVERY 5 MINUTES  AS NEEDED FOR CHEST PAIN.  DO NOT EXCEED A TOTAL OF 3 DOSES IN 15 MINUTES NOW 25 tablet 0  . omeprazole (PRILOSEC) 40 MG capsule Take 1 capsule (40 mg total) by mouth 2 (two) times daily. 180 capsule 3  . prasugrel (EFFIENT) 10 MG TABS tablet Take 1 tablet (10 mg total) by mouth daily. 30 tablet 5  . ranolazine (RANEXA) 500 MG 12 hr tablet Take 2 tablets (1,000 mg total) by mouth 2 (two) times daily. 180 tablet 3   No current facility-administered medications  on file prior to visit.     LABS/IMAGING: No results found for this or any previous visit (from the past 48 hour(s)). No results found.  WEIGHTS: Wt Readings from Last 3 Encounters:  10/05/16 270 lb (122.5 kg)  04/03/16 266 lb 12.8 oz (121 kg)  03/10/16 269 lb 9.6 oz (122.3 kg)    VITALS: BP 104/70   Pulse 73   Ht 5\' 10"  (1.778 m)   Wt 270 lb (122.5 kg)   BMI 38.74 kg/m   EXAM: General appearance: alert and no distress Neck: no carotid bruit, no JVD and thyroid not enlarged, symmetric, no tenderness/mass/nodules Lungs: clear to auscultation bilaterally Heart: regular rate and rhythm, S1, S2 normal, no murmur, click, rub or gallop Abdomen: soft, non-tender; bowel sounds normal; no masses,  no organomegaly and obese Extremities: extremities normal, atraumatic, no cyanosis or edema Pulses: 2+ and symmetric Skin: Skin color, texture, turgor normal. No rashes or lesions Neurologic: Grossly normal Psych: Mild discomfort  EKG: Sinus rhythm at 73, voltage criteria for LVH, nonspecific T-wave changes-personally reviewed  ASSESSMENT: 1. Coronary artery disease with persistent angina 2. 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 History of SVT with ablation and need for subsequent PPM 4. S/p PPM (Medtronic) 5. Dyslipidemia 6. Hypertension 7. OSA on BIPAP  PLAN: 1.   Thomas Walsh is describing some persistent angina. He has chest pain which seems to be relieved with short acting nitroglycerin  after about 15 minutes. It seems to be at rest and mostly at night after a days work. He says he gets exhausted and feels like his heart is "cramped" by the end of the day. He reports compliance with BIPAP. Despite this he is fatigued and does not get a restful night sleep. He noted improvement with long-acting nitrate will increase his Imdur to 90 mg daily. I advised him to try this for couple of weeks and if his symptoms are not necessarily improved, he may need repeat cardiac catheterization. Per his request we'll remove his pacemaker device checks to the [employer]. He is overdue for check of that which we'll arrange with Dr. Sallyanne Kuster.  Follow-up with me one month.  Pixie Casino, MD, [facility] Attending Cardiologist Hazel Fanfan C Onalee Steinbach 10/05/2016, 9:52 AM

## 2016-10-20 NOTE — Telephone Encounter (Signed)
He has a follow up with Dr. Debara Pickett in August. He will direct next steps.  Larue Drawdy Martinique MD, [facility]

## 2016-10-20 NOTE — Interval H&P Note (Signed)
History and Physical Interval Note:  10/20/2016 10:29 AM  Thomas Walsh  has presented today for surgery, with the diagnosis of recurrent cp, cad  The various methods of treatment have been discussed with the patient and family. After consideration of risks, benefits and other options for treatment, the patient has consented to  Procedure(s): Left Heart Cath and Coronary Angiography (N/A) as a surgical intervention .  The patient's history has been reviewed, patient examined, no change in status, stable for surgery.  I have reviewed the patient's chart and labs.  Questions were answered to the patient's satisfaction.   Cath Lab Visit (complete for each Cath Lab visit)  Clinical Evaluation Leading to the Procedure:   ACS: No.  Non-ACS:    Anginal Classification: CCS III  Anti-ischemic medical therapy: Maximal Therapy (2 or more classes of medications)  Non-Invasive Test Results: No non-invasive testing performed  Prior CABG: No previous CABG        Thomas Walsh [facility] 10/20/2016 10:29 AM

## 2016-10-20 NOTE — Telephone Encounter (Signed)
New Message   Wife calling Pt c/o BP issue: STAT if pt c/o blurred vision, one-sided weakness or slurred speech  1. What are your last 5 BP readings?  167/85 155/85 spikes higher in afternoon   2. Are you having any other symptoms (ex. Dizziness, headache, blurred vision, passed out)?  Chest pain, dizziness   3. What is your BP issue?  He is just feeling worse , unsteady

## 2016-10-20 NOTE — Telephone Encounter (Signed)
Pt still in hospital - just had cath procedure by Dr. Martinique today and in recovery. Wife had some concerns since cath was good, wants to know next steps d/t chest pain symptoms. Advised to discuss w MD and follow up as scheduled - will see if anything else advised - routed to Dr. Martinique for review

## 2016-10-20 NOTE — Discharge Instructions (Signed)

## 2016-10-20 NOTE — Telephone Encounter (Signed)
Left detailed message(DPR) with Dr Doug Sou message call back if anything else is needed

## 2016-11-02 ENCOUNTER — Ambulatory Visit (INDEPENDENT_AMBULATORY_CARE_PROVIDER_SITE_OTHER): Payer: BLUE CROSS/BLUE SHIELD | Admitting: Cardiology

## 2016-11-02 ENCOUNTER — Encounter: Payer: Self-pay | Admitting: Cardiology

## 2016-11-02 DIAGNOSIS — I10 Essential (primary) hypertension: Secondary | ICD-10-CM

## 2016-11-02 DIAGNOSIS — R079 Chest pain, unspecified: Secondary | ICD-10-CM

## 2016-11-02 DIAGNOSIS — Z9989 Dependence on other enabling machines and devices: Secondary | ICD-10-CM

## 2016-11-02 DIAGNOSIS — Z9861 Coronary angioplasty status: Secondary | ICD-10-CM

## 2016-11-02 DIAGNOSIS — Z950 Presence of cardiac pacemaker: Secondary | ICD-10-CM | POA: Diagnosis not present

## 2016-11-02 DIAGNOSIS — G4733 Obstructive sleep apnea (adult) (pediatric): Secondary | ICD-10-CM | POA: Diagnosis not present

## 2016-11-02 DIAGNOSIS — I2510 Atherosclerotic heart disease of native coronary artery without angina pectoris: Secondary | ICD-10-CM

## 2016-11-02 MED ORDER — AMLODIPINE BESYLATE 2.5 MG PO TABS: ORAL_TABLET | ORAL | 3 refills | Status: DC

## 2016-11-02 NOTE — Assessment & Plan Note (Signed)
Overview:   01/2014: Nuclear Stress Test: Ischemia located in the inferior wall, LV function normal at 64%  02/2014: Cardiac Cath: Critical mid LAD bridge noted, 90-99%; LCX patent, RCA patent, RPDA patent  02/2014: PCI of myocardial bridge performed - ongoing symptoms  05/2014 [facility] reading of cath: Insignificant CAD (RCA 20% only) with muscle-bridge in LAD.  Stent of LAD after IVUS of LAD  05/2014 Nuclear stress test. EF 59%. no ischemia. 10 METS. Peak HR 146,  BP188/60.  03/2016 Cardiopulmonary stress test abnormal  09/2016 Cath -no LAD ISR, no other CAD

## 2016-11-02 NOTE — Assessment & Plan Note (Signed)
Labile at home per pt

## 2016-11-02 NOTE — Assessment & Plan Note (Signed)
Compliant with C-pap 

## 2016-11-02 NOTE — Progress Notes (Signed)
11/02/2016 Thomas Walsh   31-Jul-1955  [id number]  Primary Physician System, Pcp Not In Primary Cardiologist: Dr Debara Pickett  HPI:  61 y/o male, works in [employer], never smoked, with a complicated history of CAD with procedures and evaluations done at several different facilities. See Dr Lysbeth Penner last office note for details. He was just admitted for evaluation of chest pain and had a cath done 10/20/16 that showed no significant LAD ISR and no other significant CAD. He is in the office today with his wife for follow up. He is frustrated that we have not been able to tell him why he has chest pain "tighness" and SOB. He asked that I check his pacemaker as he has noticed tachycardia at night- it was read as "no observations based on current interrogation". He says he has chest pain and SOB. His pain can radiate to his neck. He tells me his B/P has been labile at home- 967 to 893 systolic.    Current Outpatient Prescriptions  Medication Sig Dispense Refill  . aspirin 325 MG tablet Take 325 mg by mouth daily.    Marland Kitchen atorvastatin (LIPITOR) 40 MG tablet Take 1 tablet (40 mg total) by mouth daily. 90 tablet 1  . Brinzolamide-Brimonidine 1-0.2 % SUSP Apply 1 drop to eye 2 (two) times daily.     . cloNIDine (CATAPRES) 0.1 MG tablet Pt takes only if blood pressure goes over 150  2  . ezetimibe (ZETIA) 10 MG tablet Take 10 mg by mouth daily.    . fluticasone (FLONASE) 50 MCG/ACT nasal spray Place 1 spray into both nostrils daily as needed for allergies or rhinitis.    Marland Kitchen isosorbide mononitrate (IMDUR) 60 MG 24 hr tablet Take 1 tablet (60mg ) by mouth in the morning and 1/2 tablet (30mg ) in the evening. 135 tablet 3  . levothyroxine (SYNTHROID, LEVOTHROID) 100 MCG tablet Take 100 mcg by mouth daily before breakfast.    . liraglutide (VICTOZA) 18 MG/3ML SOPN Inject 0.3 mLs (1.8 mg total) into the skin daily.    . metoprolol succinate (TOPROL-XL) 50 MG 24 hr tablet Take 1 tablet (50 mg total) by mouth 2 (two) times  daily. 60 tablet 12  . nitroGLYCERIN (NITROSTAT) 0.4 MG SL tablet DISSOLVE ONE TABLET UNDER THE TONGUE EVERY 5 MINUTES AS NEEDED FOR CHEST PAIN.  DO NOT EXCEED A TOTAL OF 3 DOSES IN 15 MINUTES NOW 25 tablet 0  . omeprazole (PRILOSEC) 40 MG capsule Take 1 capsule (40 mg total) by mouth 2 (two) times daily. 180 capsule 3  . pilocarpine (PILOCAR) 2 % ophthalmic solution Place 1 drop into the right eye 4 (four) times daily.    . prasugrel (EFFIENT) 10 MG TABS tablet Take 1 tablet (10 mg total) by mouth daily. 30 tablet 5  . prednisoLONE acetate (PRED FORTE) 1 % ophthalmic suspension Place 1 drop into the left eye 3 (three) times daily.    Marland Kitchen PRESCRIPTION MEDICATION ByPap    . ranolazine (RANEXA) 500 MG 12 hr tablet Take 2 tablets (1,000 mg total) by mouth 2 (two) times daily. 180 tablet 3  . tadalafil (CIALIS) 5 MG tablet Take 5 mg by mouth as needed for erectile dysfunction.    Marland Kitchen testosterone cypionate (DEPOTESTOTERONE CYPIONATE) 100 MG/ML injection Inject 100 mg into the muscle every 14 (fourteen) days. For IM use only     No current facility-administered medications for this visit.     Allergies  Allergen Reactions  . Timolol Anaphylaxis and Other (See Comments)  Cardiac stand still  Stops heart  Pt reports hx severe bradycardia after long term use of timolol Cardiac stand still  Stops heart   . Other Other (See Comments)    Beta Blockers-Timolol caused black outs    Past Medical History:  Diagnosis Date  . Asthma   . 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 Coronary artery disease involving native coronary artery of native heart with unstable angina pectoris ([place]) 02/28/2016  . Essential hypertension 02/28/2016  . GERD (gastroesophageal reflux disease)   . HLD (hyperlipidemia)   . 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 OSA treated with BiPAP   . PAF (paroxysmal atrial fibrillation) ([place]) 02/28/2016  . PCO (posterior capsular opacification)   . Pseudophakia of both eyes    . S/P bilateral cataract extraction 11/21/2013  . S/P placement of cardiac pacemaker 02/28/2016   Medtronic  . S/P primary angioplasty with coronary stent 02/28/2016  . Secondary corneal edema of left eye   . Status post corneal transplant   . Thyroid disease     Social History   Social History  . Marital status: Married    Spouse name: N/A  . Number of children: N/A  . Years of education: N/A   Occupational History  . [employer]     Social History Main Topics  . Smoking status: Never Smoker  . Smokeless tobacco: Never Used  . Alcohol use 0.6 oz/week    1 Shots of liquor per week  . Drug use: No  . Sexual activity: Not on file   Other Topics Concern  . Not on file   Social History Narrative  . No narrative on file     Family History  Problem Relation Age of Onset  . Colon cancer Mother   . Hypertension Mother   . Kidney disease Father   . CAD Father   . Stroke Father   . Spina bifida Sister   . Thyroid disease Sister   . Diabetes Brother   . Retinal detachment Brother      Review of Systems: General: negative for chills, fever, night sweats or weight changes.  Cardiovascular: negative for chest pain, dyspnea on exertion, edema, orthopnea, palpitations, paroxysmal nocturnal dyspnea or shortness of breath Dermatological: negative for rash Respiratory: negative for cough or wheezing Urologic: negative for hematuria Abdominal: negative for nausea, vomiting, diarrhea, bright red blood per rectum, melena, or hematemesis Neurologic: negative for visual changes, syncope, or dizziness All other systems reviewed and are otherwise negative except as noted above.    Blood pressure 138/76, pulse 75, height 5\' 10"  (1.778 m), weight 272 lb 6.4 oz (123.6 kg).  General appearance: alert, cooperative, moderately obese and anxious Neck: no carotid bruit and no JVD Lungs: clear to auscultation bilaterally Heart: regular rate and rhythm Extremities: extremities normal,  atraumatic, no cyanosis or edema Skin: Skin color, texture, turgor normal. No rashes or lesions or sweaty Neurologic: Grossly normal  EKG NSR  ASSESSMENT AND PLAN:   Chest pain Recurrent chest pain of unclear etiology-cath 10/20/16 showed no LAD ISR and no other significant CAD  CAD S/P percutaneous coronary angioplasty Overview:   01/2014: Nuclear Stress Test: Ischemia located in the inferior wall, LV function normal at 64%  02/2014: Cardiac Cath: Critical mid LAD bridge noted, 90-99%; LCX patent, RCA patent, RPDA patent  02/2014: PCI of myocardial bridge performed - ongoing symptoms  05/2014 [facility] reading of cath: Insignificant CAD (RCA 20% only) with muscle-bridge in LAD.  Stent of LAD after  IVUS of LAD  05/2014 Nuclear stress test. EF 59%. no ischemia. 10 METS. Peak HR 146,  BP188/60.  03/2016 Cardiopulmonary stress test abnormal  09/2016 Cath -no LAD ISR, no other CAD  Hypertension Labile at home per pt  OSA on CPAP Compliant with C-pap  S/P placement of cardiac pacemaker Medtronic- bedside interrogation today-showed no significant arrhythmia   PLAN  I suggested we check a D-dimer, if positive he'll need a CTA. I also added Norvasc 2.5 mg daily for possible CA spasm.  F/U with Dr Debara Pickett as scheduled.  Kerin Ransom PA-C 11/02/2016 3:03 PM

## 2016-11-02 NOTE — Assessment & Plan Note (Addendum)
Medtronic- bedside interrogation today-showed no significant arrhythmia

## 2016-11-02 NOTE — Patient Instructions (Signed)
Lab today ( ddimer )   Start Amlodipine 2.5 mg every morning   Appointment with Dr.Hilty Wednesday 11/18/16 at 9:15 am   Appointment with Dr.Croitoru Wednesday 12/16/16 at 2:20 pm

## 2016-11-02 NOTE — Assessment & Plan Note (Signed)
Recurrent chest pain of unclear etiology-cath 10/20/16 showed no LAD ISR and no other significant CAD

## 2016-11-03 LAB — D-DIMER, QUANTITATIVE: D-DIMER: 0.32 mg/L FEU (ref 0.00–0.49)

## 2016-11-04 ENCOUNTER — Ambulatory Visit: Payer: BLUE CROSS/BLUE SHIELD | Admitting: Physician Assistant

## 2016-11-08 ENCOUNTER — Other Ambulatory Visit: Payer: Self-pay | Admitting: Internal Medicine

## 2016-11-13 ENCOUNTER — Other Ambulatory Visit: Payer: Self-pay | Admitting: Internal Medicine

## 2016-11-13 NOTE — Telephone Encounter (Signed)
New message     *STAT* If patient is at the pharmacy, call can be transferred to refill team.   1. Which medications need to be refilled? (please list name of each medication and dose if known) RANEXA 500 MG 12 hr tablet  2. Which pharmacy/location (including street and city if local pharmacy) is medication to be sent to? [facility]  3. Do they need a 30 day or 90 day supply? 30 day

## 2016-11-13 NOTE — Telephone Encounter (Signed)
RANEXA 500 MG 12 hr tablet 180 tablet 3 11/13/2016    Sig: TAKE 2 TABLETS BY MOUTH TWICE DAILY   Sent to pharmacy as: RANEXA 500 MG 12 hr tablet   Notes to Pharmacy: Please consider 90 day supplies to promote better adherence   E-Prescribing Status: Receipt confirmed by pharmacy (11/13/2016 9:36 AM EDT)   Pharmacy   [place], [place]   Patient's wife notified (on [place])

## 2016-11-18 ENCOUNTER — Encounter: Payer: Self-pay | Admitting: Neurology

## 2016-11-18 ENCOUNTER — Ambulatory Visit (INDEPENDENT_AMBULATORY_CARE_PROVIDER_SITE_OTHER): Payer: BLUE CROSS/BLUE SHIELD | Admitting: Internal Medicine

## 2016-11-18 ENCOUNTER — Encounter: Payer: Self-pay | Admitting: Internal Medicine

## 2016-11-18 VITALS — BP 123/77 | HR 80 | Ht 70.0 in | Wt 269.8 lb

## 2016-11-18 DIAGNOSIS — R4789 Other speech disturbances: Secondary | ICD-10-CM

## 2016-11-18 DIAGNOSIS — G8929 Other chronic pain: Secondary | ICD-10-CM | POA: Diagnosis not present

## 2016-11-18 DIAGNOSIS — R079 Chest pain, unspecified: Secondary | ICD-10-CM

## 2016-11-18 DIAGNOSIS — R200 Anesthesia of skin: Secondary | ICD-10-CM

## 2016-11-18 DIAGNOSIS — R51 Headache: Secondary | ICD-10-CM | POA: Diagnosis not present

## 2016-11-18 DIAGNOSIS — R519 Headache, unspecified: Secondary | ICD-10-CM

## 2016-11-18 DIAGNOSIS — R42 Dizziness and giddiness: Secondary | ICD-10-CM | POA: Diagnosis not present

## 2016-11-18 NOTE — Progress Notes (Signed)
OFFICE NOTE  Chief Complaint:  Chest pain, headache, imbalance, left arm pain  Primary Care Physician: System, Pcp Not In  HPI:  Thomas Walsh is a 61 y.o. male who was referred from another patient of mine who is his brother-in-law. His primary physician is [name], MD and he sees Dr. Marcello Moores of [facility] in [place], [place]. Thomas Walsh has a very complex cardiac history. He was noted to have had a left heart catheterization at [facility] in 2000 for profound symptomatic bradycardia with heart rate of 30s related to timolol eyedrops, but was found to have no obstructive coronary disease at the time. There is a family history of premature coronary artery disease Apparently in late 2015 he saw Dr. Marcello Moores in the office because of chest discomfort. He had described that as "poking, pressure and squeezing which awoke him from sleep". He was scheduled to have a stress test however during the EKG portion of the stress test there were changes suggestive of ischemia and the test was discontinued. Interpretation of that nuclear stress test was "myocardial perfusion imaging is positive for inducible ischemia. There are regions of reversible ischemia located in the inferior wall of the heart. Overall left ventricular systolic function is normal without regional wall motion abnormalities noted above." He was then going to be scheduled for elective heart catheterization however he presented to the emergency department due to continued chest pain. He underwent left heart catheterization on 03/07/2014. 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 After the procedure however he continued to have chest pain symptoms. He was placed on aspirin and Effient and started on low-dose  calcium channel blocker which caused low blood pressure and was discontinued. Cardiac markers were noted to be mildly elevated the next day at 0.41 troponin which was previously negative on 03/06/2014. LDL at that time was 52. He was then admitted to [facility] in [place], [place] for chest pain in January 2016. He also complained of palpitations and was found to have an ectopic atrial rhythm with normal ventricular response. He claims that he had had significant tachycardia and was thought to have had an SVT. He reports he subsequently presented and required treatment for an SVT on another occasion when he presented to the hospital around [place], [place]. He said that he received adenosine 6 mg and then 12 mg and ultimately converted to sinus. He then was reportedly referred to Dr. Nyra Market in [facility] who scheduled him for ablation. Apparently afterwards he reports that the ablation was not totally successful however a pacemaker was required to be placed - this leads me to believe that there may have been AV nodal damage at the time. 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 The pacemaker is by his report a Medtronic device and he does have a home monitoring. He said most recently he was 94% paced. He has had progressive antianginal regimens including Ranexa and topical nitrate. 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 He underwent his last cardiac catheterization in September 2017 which demonstrated only 30% stenosis at the distal edge of the mid LAD stent. FFR was performed which was 0.89. Apparently he was continuing to have recurrent arrhythmias. Thomas Walsh also has had an  opinion from a cardiologist at [facility]. I cannot find records in care everywhere to support this however he does have some paperwork indicating he had an appointment. He does not feel that that appointment was helpful. He did bring a CD with  images of his echo and cath which I will review. His main complaint today is continued chest pain.   04/03/2016  Thomas Walsh returns today for follow-up. I was able to review his extensive records and understand he is history including placement of a Medtronic advise a MRI safe pacemaker on 10/18/2014 for sick sinus syndrome. Subsequently he developed some PAF but reportedly has a very low burden of this. 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 Recently he saw Dr. Lovena Le and was not felt to be in heart block or to be pacer dependent. Because of his dyspnea he was scheduled for a cardiopulmonary exercise test. Thomas Walsh did undergo that study which indicated no significant circulatory limitation to exercise. His peak V02 was 103%. Pulmonary function was within normal limits. It was felt that likely his symptoms were related to obesity. These are preliminary results pending a cardiology over read, but I agree with those findings. He says that he said much less angina since adjusting his medications. He is now on imdur 60 mg daily as compared to his nitroglycerin patch. Although he has some mild headaches he says that he has not needed any short acting nitroglycerin in the last 3 weeks. He is also on an increased dose of metoprolol, namely 100 mg daily. He is generally pleased with his improvement in symptoms.  10/05/2016  Thomas Walsh was seen today in follow-up. He reports that over the past several weeks she's had more chest pain. He describes this as sharp and knifelike in the anterior left chest. It's not necessarily worse with exertion, in fact it's worse at night when he lays down after going to sleep. He's been more fatigued and gets short of breath somewhat easier. He had a stress test recently which was nonischemic (this was a cardiopulmonary exercise test, but was noted to be some maximal. It was felt that his  limitation exercise was due to obesity. He reports responsiveness to nitrates recently and says that he ran out of his isosorbide which caused him more chest pain but that it improved when he restarted it, however despite that he continues to have pain and requires short acting nitroglycerin for some relief. He says that some of his symptoms feel like his angina in the past. He's been established in our pacemaker clinic and had a check in December with Dr. Lovena Le however wishes to switch his follow-up to [facility].  11/18/2016  Thomas Walsh was seen today in follow-up. Over the past month he had recurrent anginal symptoms despite increasing his isosorbide and wish to have heart catheterization. He underwent left heart catheterization by Dr. Martinique on 10/20/2016 which showed a 20% mid-LAD stenosis normal LV systolic function, normal LVEDP and a patent stent. Despite this he continued to have chest discomfort and was started on low-dose amlodipine 2.5 mg. Initially note some relief in this and does report that his blood pressure is much better controlled however he still having symptoms. He said yesterday he felt horrible with a symptoms including dizziness, nausea, imbalance, a posterior neck base headache and pain that radiated down the left arm. He's also had some gait difficulty and reports recently  having some word finding difficulty. This concerns me of about a possible neurologic cause of his symptoms and perhaps is pain is a neuropathic or chest wall pain. At this point it's unlikely this is anginal pain.  PMHx:  Past Medical History:  Diagnosis Date  . Asthma   . 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 Coronary artery disease involving native coronary artery of native heart with unstable angina pectoris ([place]) 02/28/2016  . Essential hypertension 02/28/2016  . GERD (gastroesophageal reflux disease)   . HLD (hyperlipidemia)   . 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 OSA treated with BiPAP   .  PAF (paroxysmal atrial fibrillation) ([place]) 02/28/2016  . PCO (posterior capsular opacification)   . Pseudophakia of both eyes   . S/P bilateral cataract extraction 11/21/2013  . S/P placement of cardiac pacemaker 02/28/2016   Medtronic  . S/P primary angioplasty with coronary stent 02/28/2016  . Secondary corneal edema of left eye   . Status post corneal transplant   . Thyroid disease     Past Surgical History:  Procedure Laterality Date  . APPENDECTOMY    . AQUEOUS SHUNT EXTRAOCULAR Left 09/18/2013   Elenore Rota L. Pennie Banter, MD [facility]  . CARDIAC CATHETERIZATION  03/07/2014  . CATARACT EXTRACTION Bilateral   . CATARACT EXTRACTION W/ INTRAOCULAR LENS IMPLANT Left 01/2012   [place]Cu[place], MD  . CATARACT EXTRACTION W/ INTRAOCULAR LENS IMPLANT Right 2003   [facility]  . CORNEAL TRANSPLANT Left 01/17/2015   Keratoplasty, Endothelial. Nelma Rothman, MD [place]   . DESCEMETS STRIPPING AUTOMATED ENDOTHELIAL KERATOPLASTY Left 04/2012   Marilynne Halsted, MD;  . GLAUCOMA SURGERY Left 09/18/2013   [place] BG-101 implant with cornea graft  . LEFT HEART CATH AND CORONARY ANGIOGRAPHY N/A 10/20/2016   Procedure: Left Heart Cath and Coronary Angiography;  Surgeon: Martinique, Peter M, MD;  Location: [facility];  Service: Cardiovascular;  Laterality: N/A;  . PACEMAKER INSERTION  09/2014   Done at [facility].   [place] Levo REINFORCEMENT Left 09/18/2013   with graft, Edson Snowball, MD, [facility]  . WISDOM TOOTH EXTRACTION      FAMHx:  Family History  Problem Relation Age of Onset  . Colon cancer Mother   . Hypertension Mother   . Kidney disease Father   . CAD Father   . Stroke Father   . Spina bifida Sister   . Thyroid disease Sister   . Diabetes Brother   . Retinal detachment Brother     SOCHx:   reports that he has never smoked. He has never used smokeless tobacco. He reports that he drinks about 0.6 oz of alcohol per week . He reports that he does not use drugs.  ALLERGIES:  Allergies    Allergen Reactions  . Timolol Anaphylaxis and Other (See Comments)    Cardiac stand still  Stops heart  Pt reports hx severe bradycardia after long term use of timolol Cardiac stand still  Stops heart   . Other Other (See Comments)    Beta Blockers-Timolol caused black outs    ROS: Pertinent items noted in HPI and remainder of comprehensive ROS otherwise negative.  HOME MEDS: Current Outpatient Prescriptions on File Prior to Visit  Medication Sig Dispense Refill  . amLODipine (NORVASC) 2.5 MG tablet Take 2.5 mg every morning 90 tablet 3  . aspirin 325 MG tablet Take 325 mg by mouth daily.    Marland Kitchen atorvastatin (LIPITOR) 40 MG tablet Take 1 tablet (40 mg total) by  mouth daily. 90 tablet 1  . Brinzolamide-Brimonidine 1-0.2 % SUSP Apply 1 drop to eye 2 (two) times daily.     . cloNIDine (CATAPRES) 0.1 MG tablet Pt takes only if blood pressure goes over 150  2  . ezetimibe (ZETIA) 10 MG tablet Take 10 mg by mouth daily.    . fluticasone (FLONASE) 50 MCG/ACT nasal spray Place 1 spray into both nostrils daily as needed for allergies or rhinitis.    Marland Kitchen isosorbide mononitrate (IMDUR) 60 MG 24 hr tablet Take 1 tablet (60mg ) by mouth in the morning and 1/2 tablet (30mg ) in the evening. 135 tablet 3  . levothyroxine (SYNTHROID, LEVOTHROID) 100 MCG tablet Take 100 mcg by mouth daily before breakfast.    . liraglutide (VICTOZA) 18 MG/3ML SOPN Inject 0.3 mLs (1.8 mg total) into the skin daily.    . metoprolol succinate (TOPROL-XL) 50 MG 24 hr tablet Take 1 tablet (50 mg total) by mouth 2 (two) times daily. 60 tablet 12  . nitroGLYCERIN (NITROSTAT) 0.4 MG SL tablet DISSOLVE ONE TABLET UNDER THE TONGUE EVERY 5 MINUTES AS NEEDED FOR CHEST PAIN.  DO NOT EXCEED A TOTAL OF 3 DOSES IN 15 MINUTES NOW 25 tablet 0  . omeprazole (PRILOSEC) 40 MG capsule Take 1 capsule (40 mg total) by mouth 2 (two) times daily. 180 capsule 3  . pilocarpine (PILOCAR) 2 % ophthalmic solution Place 1 drop into the right eye 4 (four)  times daily.    . prasugrel (EFFIENT) 10 MG TABS tablet Take 1 tablet (10 mg total) by mouth daily. 30 tablet 5  . prednisoLONE acetate (PRED FORTE) 1 % ophthalmic suspension Place 1 drop into the left eye 3 (three) times daily.    Marland Kitchen PRESCRIPTION MEDICATION ByPap    . RANEXA 500 MG 12 hr tablet TAKE 2 TABLETS BY MOUTH TWICE DAILY 180 tablet 3  . tadalafil (CIALIS) 5 MG tablet Take 5 mg by mouth as needed for erectile dysfunction.    Marland Kitchen testosterone cypionate (DEPOTESTOTERONE CYPIONATE) 100 MG/ML injection Inject 100 mg into the muscle every 14 (fourteen) days. For IM use only     No current facility-administered medications on file prior to visit.     LABS/IMAGING: No results found for this or any previous visit (from the past 48 hour(s)). No results found.  WEIGHTS: Wt Readings from Last 3 Encounters:  11/18/16 269 lb 12.8 oz (122.4 kg)  11/02/16 272 lb 6.4 oz (123.6 kg)  10/20/16 270 lb (122.5 kg)    VITALS: BP 123/77   Pulse 80   Ht 5\' 10"  (1.778 m)   Wt 269 lb 12.8 oz (122.4 kg)   BMI 38.71 kg/m   EXAM: Deferred  EKG: Deferred  ASSESSMENT: 1. Chronic chest pain, dizziness, headache, word finding difficulty, left arm pain 2. Coronary artery disease with persistent angina 3. Myocardial bridge status post PCI in 02/2014 with a Promus Premier DES 4. History of SVT with ablation and need for subsequent PPM 5. S/p PPM (Medtronic) 6. Dyslipidemia 7. Hypertension 8. OSA on BIPAP  PLAN: 1.   Thomas Walsh is a new constellation of symptoms including some dizziness, headache, word finding difficulty and other neurologic sounding symptoms, but his cardiac workup has been negative including recent heart catheterization which showed no obstructive coronary disease and he is on appropriate multi drug therapy for coronary spasm or small vessel ischemia. Despite this he is not improving and I think we need to consider other causes of his symptoms. I like to  refer him to neurology for  further evaluation of some of these other symptoms. We'll continue his current medications. He has a pacemaker followed which is routine in September with Dr. Loletha Grayer in our [facility] office.  Follow-up with me in 3 months.  Pixie Casino, MD, [facility] Attending Cardiologist [place] 11/18/2016, 9:38 AM

## 2016-11-18 NOTE — Patient Instructions (Addendum)
You have been referred to [facility]   Your physician recommends that you schedule a follow-up appointment in: [place] with Dr. Debara Pickett

## 2016-12-08 ENCOUNTER — Other Ambulatory Visit: Payer: Self-pay | Admitting: *Deleted

## 2016-12-08 ENCOUNTER — Telehealth: Payer: Self-pay | Admitting: Internal Medicine

## 2016-12-08 ENCOUNTER — Other Ambulatory Visit: Payer: Self-pay | Admitting: Internal Medicine

## 2016-12-08 MED ORDER — PRASUGREL HCL 10 MG PO TABS: 10.0000 mg | ORAL_TABLET | Freq: Every day | ORAL | 2 refills | Status: DC

## 2016-12-08 NOTE — Telephone Encounter (Signed)
°*  STAT* If patient is at the pharmacy, call can be transferred to refill team.   1. Which medications need to be refilled? (please list name of each medication and dose if known) Prasugrel 10mg    2. Which pharmacy/location (including street and city if local pharmacy) is medication to be sent to?[facility] in [place] -[phone]  3. Do they need a 30 day or 90 day supply? [place]

## 2016-12-08 NOTE — Telephone Encounter (Signed)
REFILL 

## 2016-12-16 ENCOUNTER — Encounter: Payer: BLUE CROSS/BLUE SHIELD | Admitting: Cardiovascular Disease

## 2017-01-25 ENCOUNTER — Ambulatory Visit (INDEPENDENT_AMBULATORY_CARE_PROVIDER_SITE_OTHER): Payer: BLUE CROSS/BLUE SHIELD | Admitting: Neurology

## 2017-01-25 ENCOUNTER — Encounter: Payer: Self-pay | Admitting: Neurology

## 2017-01-25 VITALS — BP 118/76 | HR 76 | Ht 70.0 in | Wt 268.0 lb

## 2017-01-25 DIAGNOSIS — G629 Polyneuropathy, unspecified: Secondary | ICD-10-CM

## 2017-01-25 DIAGNOSIS — R4789 Other speech disturbances: Secondary | ICD-10-CM

## 2017-01-25 DIAGNOSIS — R519 Headache, unspecified: Secondary | ICD-10-CM

## 2017-01-25 DIAGNOSIS — R42 Dizziness and giddiness: Secondary | ICD-10-CM

## 2017-01-25 DIAGNOSIS — R51 Headache: Secondary | ICD-10-CM | POA: Diagnosis not present

## 2017-01-25 NOTE — Patient Instructions (Signed)
1. Please ask your PCP to check TSH, B12, RPR, SPEP/IFE, ESR, CRP as workup for neuropathy 2. Schedule MRI brain without contrast 3. Schedule MRI cervical spine without contrast 4. Refer for vestibular therapy for vertigo 5. Follow-up in 3-4 months, call for any changes

## 2017-01-25 NOTE — Progress Notes (Signed)
NEUROLOGY CONSULTATION NOTE  Thomas Walsh MRN: [id number] DOB: [birth year]  Referring provider: Dr. Lyman Bishop Primary care provider: [name], PA-C  Reason for consult:  Dizziness, imbalance, headache, word-finding difficulties  Dear Dr Debara Pickett:  Thank you for your kind referral of Thomas Walsh for consultation of the above symptoms. Although his history is well known to you, please allow me to reiterate it for the purpose of our medical record. Records and images were personally reviewed where available.  HISTORY OF PRESENT ILLNESS: This is a very pleasant 61 year old right-handed man with a history of hypertension, hyperlipidemia, thyroid disease, congenital glaucoma s/p multiple eye surgeries, complicated cardiac history as reviewed on Dr. Lysbeth Penner note. He has had several cardiac procedures done, including pacemaker placement for sick sinus syndrome in 2016. He started having an increase in chest pain last July 2018 despite increasing isosorbide dose. He had a cardiac cath which showed 20% mid-LAD stenosis, normal LV systolic function, normal LVEDP, and patent stent. He was started on low dose amlodipine and states that when he had seen Dr. Debara Pickett 2 weeks later, he was still having a lot of chest pain, but a week after, the chest pain has been much better. He has not needed to take isosorbide in the past 1.5-2 months. He also reported dizziness, nausea, imbalance, posterior headaches, gait difficulty, and word-finding difficulties. He presents today for evaluation.   He reports symptoms started last summer, he noticed that he would struggle with talking, trying to figure out what he was going to say. In the office today, he had some word-finding difficulties and pointed it out as what usually occurs. Around this time, he also started having pain in his neck, mid-back, and shoulders. He also started having dizziness described as a sensation of movement. It was worse last Monday when he  rolled out of bed, the room was moving for 2-3 minutes. He had to lay still, there was some nausea. He feels unsteady pretty regularly, his daughter has told him he walks sideways half the time. Changing positions triggers the vertigo, he has difficulty bending his head down, standing up, or getting out of bed. He reports his BP has been stable. He has noticed some decrease in hearing, and intermittent high-pitched tinnitus in either ear. He has also been having a lot of pressure in the bilateral occipital regions, 4 to 5 over 10 in intensity. He has a history of migraines related to his glaucoma, but pain with this is usually over the temporal regions. He feels his vision is a little blurred, but denies any diplopia. He is also reporting mid-back pain, worse in the past 3 months, especially when standing for prolonged periods. He has noticed weakness in both hands, he has difficulty gripping things for a long time. He denies any paresthesias in his hands, but both legs go numb when he stands for prolonged periods. He has a history of RLS, worse when traveling. He denies any bowel/bladder dysfunction.     PAST MEDICAL HISTORY: Past Medical History:  Diagnosis Date  . Asthma   . 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 Coronary artery disease involving native coronary artery of native heart with unstable angina pectoris ([place]) 02/28/2016  . Essential hypertension 02/28/2016  . GERD (gastroesophageal reflux disease)   . HLD (hyperlipidemia)   . 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 OSA treated with BiPAP   . PAF (paroxysmal atrial fibrillation) ([place]) 02/28/2016  . PCO (posterior capsular opacification)   .  Pseudophakia of both eyes   . S/P bilateral cataract extraction 11/21/2013  . S/P placement of cardiac pacemaker 02/28/2016   [employer]  . S/P primary angioplasty with coronary stent 02/28/2016  . Secondary corneal edema of left eye   . Status post corneal transplant   . Thyroid disease      PAST SURGICAL HISTORY: Past Surgical History:  Procedure Laterality Date  . APPENDECTOMY    . AQUEOUS SHUNT EXTRAOCULAR Left 09/18/2013   Elenore Rota L. Pennie Banter, MD [facility]  . CARDIAC CATHETERIZATION  03/07/2014  . CATARACT EXTRACTION Bilateral   . CATARACT EXTRACTION W/ INTRAOCULAR LENS IMPLANT Left 01/2012   McCu[place], MD  . CATARACT EXTRACTION W/ INTRAOCULAR LENS IMPLANT Right 2003   [place]  . CORNEAL TRANSPLANT Left 01/17/2015   Keratoplasty, Endothelial. Nelma Rothman, MD [place]   . DESCEMETS STRIPPING AUTOMATED ENDOTHELIAL KERATOPLASTY Left 04/2012   Marilynne Halsted, MD;  . GLAUCOMA SURGERY Left 09/18/2013   [place] BG-101 implant with cornea graft  . LEFT HEART CATH AND CORONARY ANGIOGRAPHY N/A 10/20/2016   Procedure: Left Heart Cath and Coronary Angiography;  Surgeon: Martinique, Peter M, MD;  Location: [facility];  Service: Cardiovascular;  Laterality: N/A;  . PACEMAKER INSERTION  09/2014   Done at [facility].   [place] Levo REINFORCEMENT Left 09/18/2013   with graft, Edson Snowball, MD, [facility]  . WISDOM TOOTH EXTRACTION      MEDICATIONS: Current Outpatient Prescriptions on File Prior to Visit  Medication Sig Dispense Refill  . amLODipine (NORVASC) 2.5 MG tablet Take 2.5 mg every morning 90 tablet 3  . aspirin 325 MG tablet Take 325 mg by mouth daily.    Marland Kitchen atorvastatin (LIPITOR) 40 MG tablet Take 1 tablet (40 mg total) by mouth daily. 90 tablet 1  . Brinzolamide-Brimonidine 1-0.2 % SUSP Apply 1 drop to eye 2 (two) times daily.     . cloNIDine (CATAPRES) 0.1 MG tablet Pt takes only if blood pressure goes over 150  2  . ezetimibe (ZETIA) 10 MG tablet Take 10 mg by mouth daily.    . fluticasone (FLONASE) 50 MCG/ACT nasal spray Place 1 spray into both nostrils daily as needed for allergies or rhinitis.    Marland Kitchen isosorbide mononitrate (IMDUR) 60 MG 24 hr tablet Take 1 tablet (60mg ) by mouth in the morning and 1/2 tablet (30mg ) in the evening. 135 tablet 3  .  levothyroxine (SYNTHROID, LEVOTHROID) 100 MCG tablet Take 100 mcg by mouth daily before breakfast.    . liraglutide (VICTOZA) 18 MG/3ML SOPN Inject 0.3 mLs (1.8 mg total) into the skin daily.    . metoprolol succinate (TOPROL-XL) 50 MG 24 hr tablet Take 1 tablet (50 mg total) by mouth 2 (two) times daily. 60 tablet 12  . nitroGLYCERIN (NITROSTAT) 0.4 MG SL tablet DISSOLVE ONE TABLET UNDER THE TONGUE EVERY 5 MINUTES AS NEEDED FOR CHEST PAIN.  DO NOT EXCEED A TOTAL OF 3 DOSES IN 15 MINUTES NOW 25 tablet 0  . omeprazole (PRILOSEC) 40 MG capsule Take 1 capsule (40 mg total) by mouth 2 (two) times daily. 180 capsule 3  . pilocarpine (PILOCAR) 2 % ophthalmic solution Place 1 drop into the right eye 4 (four) times daily.    . prasugrel (EFFIENT) 10 MG TABS tablet Take 1 tablet (10 mg total) by mouth daily. 90 tablet 2  . prednisoLONE acetate (PRED FORTE) 1 % ophthalmic suspension Place 1 drop into the left eye 3 (three) times daily.    Marland Kitchen  PRESCRIPTION MEDICATION ByPap    . RANEXA 500 MG 12 hr tablet TAKE 2 TABLETS BY MOUTH TWICE DAILY 180 tablet 3  . tadalafil (CIALIS) 5 MG tablet Take 5 mg by mouth as needed for erectile dysfunction.    Marland Kitchen testosterone cypionate (DEPOTESTOTERONE CYPIONATE) 100 MG/ML injection Inject 100 mg into the muscle every 14 (fourteen) days. For IM use only     No current facility-administered medications on file prior to visit.     ALLERGIES: Allergies  Allergen Reactions  . Timolol Anaphylaxis and Other (See Comments)    Cardiac stand still  Stops heart  Pt reports hx severe bradycardia after long term use of timolol Cardiac stand still  Stops heart   . Other Other (See Comments)    Beta Blockers-Timolol caused black outs    FAMILY HISTORY: Family History  Problem Relation Age of Onset  . Colon cancer Mother   . Hypertension Mother   . Kidney disease Father   . CAD Father   . Stroke Father   . Spina bifida Sister   . Thyroid disease Sister   . Diabetes Brother    . Retinal detachment Brother     SOCIAL HISTORY: Social History   Social History  . Marital status: Married    Spouse name: N/A  . Number of children: N/A  . Years of education: N/A   Occupational History  . hvac     Social History Main Topics  . Smoking status: Never Smoker  . Smokeless tobacco: Never Used  . Alcohol use 0.6 oz/week    1 Shots of liquor per week  . Drug use: No  . Sexual activity: Not on file   Other Topics Concern  . Not on file   Social History Narrative  . No narrative on file    REVIEW OF SYSTEMS: Constitutional: No fevers, chills, or sweats, no generalized fatigue, change in appetite Eyes: No visual changes, double vision, eye pain Ear, nose and throat: +some hearing loss, no ear pain, nasal congestion, sore throat Cardiovascular: No chest pain, palpitations Respiratory:  No shortness of breath at rest or with exertion, wheezes GastrointestinaI: No nausea, vomiting, diarrhea, abdominal pain, fecal incontinence Genitourinary:  No dysuria, urinary retention or frequency Musculoskeletal:  + neck pain, back pain Integumentary: No rash, pruritus, skin lesions Neurological: as above Psychiatric: No depression, insomnia, anxiety Endocrine: No palpitations, fatigue, diaphoresis, mood swings, change in appetite, change in weight, increased thirst Hematologic/Lymphatic:  No anemia, purpura, petechiae. Allergic/Immunologic: no itchy/runny eyes, nasal congestion, recent allergic reactions, rashes  PHYSICAL EXAM: Vitals:   01/25/17 1025  BP: 118/76  Pulse: 76  SpO2: 95%   General: No acute distress Head:  Normocephalic/atraumatic, no temporal tenderness Eyes: Fundoscopic exam unable to do on right eye, sharp disc on left Neck: supple, + paraspinal tenderness, full range of motion Back: No paraspinal tenderness Heart: regular rate and rhythm Lungs: Clear to auscultation bilaterally. Vascular: No carotid bruits. Skin/Extremities: No rash, no  edema Neurological Exam: Mental status: alert and oriented to person, place, and time, no dysarthria or aphasia, Fund of knowledge is appropriate.  Recent and remote memory are intact.  Attention and concentration are normal.    Able to name objects and repeat phrases. Cranial nerves: CN I: not tested CN II: right pupil pinpoint (s/p pilocarpine drop this morning), left pupil 69mm reactive to light, visual fields intact. CN III, IV, VI:  +left esotropia, full range of motion on individual eye testing. When tested with binocular vision,  he is noted to have limited range of movement on left eye medial gaze, no nystagmus, no ptosis CN V: facial sensation intact CN VII: upper and lower face symmetric CN VIII: hearing intact to finger rub CN IX, X: gag intact, uvula midline CN XI: sternocleidomastoid and trapezius muscles intact CN XII: tongue midline Bulk & Tone: normal, no fasciculations. Motor: 5/5 throughout with no pronator drift. Sensation: intact to light touch, cold, pin on both UE, decreased pin in stocking distribution on right LE, decreased vibration to ankles bilaterally, intact joint position sense.  No extinction to double simultaneous stimulation.  Romberg test strongly positive Deep Tendon Reflexes: +2 throughout, no ankle clonus, negative Hoffman sign Plantar responses: downgoing bilaterally Cerebellar: no incoordination on finger to nose testing Gait: slightly wide-based, no ataxia, unable to tandem walk Tremor: none  IMPRESSION: This is a very pleasant 61 year old right-handed man with a history of hypertension, hyperlipidemia, thyroid disease, congenital glaucoma s/p multiple eye surgeries, complicated cardiac history, who was having a lot of chest pain despite unremarkable cardiac cath, as well as new onset dizziness, bilateral occipital headaches, gait instability, and word-finding difficulties. He reports the chest pain has resolved over the past 1.5-2 months. 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 Dizziness suggestive of peripheral vertigo, he will be referred for vestibular therapy. The new onset headaches are unclear in etiology, potentially cervicogenic. MRI brain without contrast and MRI cervical spine without contrast will be ordered to assess for underlying structural abnormality. If there is evidence of cervical pathology, he will be referred for PT for neck pain. Neuropathy bloodwork will be ordered through his PCP. We discussed the word-finding difficulties, if no significant intracranial abnormalities on MRI brain, he will be scheduled for Neurocognitive testing to further evaluate cognitive issues. He will follow-up in 3-4 months and knows to call for any changes.   Thank you for allowing me to participate in the care of this patient. Please do not hesitate to call for any questions or concerns.   Ellouise Newer, M.D.  CC: Dr. Debara Pickett, [name], PA-C

## 2017-02-16 ENCOUNTER — Telehealth: Payer: Self-pay | Admitting: Neurology

## 2017-02-16 ENCOUNTER — Ambulatory Visit (INDEPENDENT_AMBULATORY_CARE_PROVIDER_SITE_OTHER): Payer: BLUE CROSS/BLUE SHIELD | Admitting: Internal Medicine

## 2017-02-16 ENCOUNTER — Encounter: Payer: Self-pay | Admitting: Internal Medicine

## 2017-02-16 VITALS — BP 118/70 | HR 69 | Ht 70.0 in | Wt 262.2 lb

## 2017-02-16 DIAGNOSIS — I10 Essential (primary) hypertension: Secondary | ICD-10-CM | POA: Diagnosis not present

## 2017-02-16 DIAGNOSIS — Z9989 Dependence on other enabling machines and devices: Secondary | ICD-10-CM

## 2017-02-16 DIAGNOSIS — G4733 Obstructive sleep apnea (adult) (pediatric): Secondary | ICD-10-CM | POA: Diagnosis not present

## 2017-02-16 DIAGNOSIS — Z9861 Coronary angioplasty status: Secondary | ICD-10-CM | POA: Diagnosis not present

## 2017-02-16 DIAGNOSIS — Z950 Presence of cardiac pacemaker: Secondary | ICD-10-CM | POA: Diagnosis not present

## 2017-02-16 DIAGNOSIS — I2510 Atherosclerotic heart disease of native coronary artery without angina pectoris: Secondary | ICD-10-CM

## 2017-02-16 MED ORDER — NITROGLYCERIN 0.4 MG SL SUBL: 0.4000 mg | SUBLINGUAL_TABLET | SUBLINGUAL | 3 refills | Status: DC | PRN

## 2017-02-16 NOTE — Progress Notes (Signed)
OFFICE NOTE  Chief Complaint:  No complaints  Primary Care Physician: Druscilla Brownie, PA-C  HPI:  Thomas Walsh is a 61 y.o. male who was referred from another patient of mine who is his brother-in-law. His primary physician is [name], MD and he sees Dr. Marcello Moores of [facility] in [place], [place]. Mr. [name] has a very complex cardiac history. He was noted to have had a left heart catheterization at [facility] in 2000 for profound symptomatic bradycardia with heart rate of 30s related to timolol eyedrops, but was found to have no obstructive coronary disease at the time. There is a family history of premature coronary artery disease Apparently in late 2015 he saw Dr. Marcello Moores in the office because of chest discomfort. He had described that as "poking, pressure and squeezing which awoke him from sleep". He was scheduled to have a stress test however during the EKG portion of the stress test there were changes suggestive of ischemia and the test was discontinued. Interpretation of that nuclear stress test was "myocardial perfusion imaging is positive for inducible ischemia. There are regions of reversible ischemia located in the inferior wall of the heart. Overall left ventricular systolic function is normal without regional wall motion abnormalities noted above." He was then going to be scheduled for elective heart catheterization however he presented to the emergency department due to continued chest pain. He underwent left heart catheterization on 03/07/2014. 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 After the procedure however he continued to have chest pain symptoms. He was placed on aspirin and Effient and started on low-dose calcium channel blocker which  caused low blood pressure and was discontinued. Cardiac markers were noted to be mildly elevated the next day at 0.41 troponin which was previously negative on 03/06/2014. LDL at that time was 52. He was then admitted to [facility] in [place], [place] for chest pain in January 2016. He also complained of palpitations and was found to have an ectopic atrial rhythm with normal ventricular response. He claims that he had had significant tachycardia and was thought to have had an SVT. He reports he subsequently presented and required treatment for an SVT on another occasion when he presented to the hospital around [place], [place]. He said that he received adenosine 6 mg and then 12 mg and ultimately converted to sinus. He then was reportedly referred to Dr. Nyra Market in [facility] who scheduled him for ablation. Apparently afterwards he reports that the ablation was not totally successful however a pacemaker was required to be placed - this leads me to believe that there may have been AV nodal damage at the time. 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 The pacemaker is by his report a Medtronic device and he does have a home monitoring. He said most recently he was 94% paced. He has had progressive antianginal regimens including Ranexa and topical nitrate. 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 He underwent his last cardiac catheterization in September 2017 which demonstrated only 30% stenosis at the distal edge of the mid LAD stent. FFR was performed which was 0.89. Apparently he was continuing to have recurrent arrhythmias. Mr. Junious also has had an opinion from a cardiologist at  [facility]. I cannot find records in care everywhere to support this however he does have some paperwork indicating he had an appointment. He does not feel that that appointment was helpful. He did bring a CD with images of his echo and cath which I  will review. His main complaint today is continued chest pain.   04/03/2016  [name] returns today for follow-up. I was able to review his extensive records and understand he is history including placement of a Medtronic advise a MRI safe pacemaker on 10/18/2014 for sick sinus syndrome. Subsequently he developed some PAF but reportedly has a very low burden of this. 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 Recently he saw Dr. Lovena Le and was not felt to be in heart block or to be pacer dependent. Because of his dyspnea he was scheduled for a cardiopulmonary exercise test. Mr. Gillian did undergo that study which indicated no significant circulatory limitation to exercise. His peak V02 was 103%. Pulmonary function was within normal limits. It was felt that likely his symptoms were related to obesity. These are preliminary results pending a cardiology over read, but I agree with those findings. He says that he said much less angina since adjusting his medications. He is now on imdur 60 mg daily as compared to his nitroglycerin patch. Although he has some mild headaches he says that he has not needed any short acting nitroglycerin in the last 3 weeks. He is also on an increased dose of metoprolol, namely 100 mg daily. He is generally pleased with his improvement in symptoms.  10/05/2016  [name] was seen today in follow-up. He reports that over the past several weeks she's had more chest pain. He describes this as sharp and knifelike in the anterior left chest. It's not necessarily worse with exertion, in fact it's worse at night when he lays down after going to sleep. He's been more fatigued and gets short of breath somewhat easier. He had a stress test recently which was nonischemic (this was a cardiopulmonary exercise test, but was noted to be some maximal. It was felt that his limitation exercise was due to obesity. He  reports responsiveness to nitrates recently and says that he ran out of his isosorbide which caused him more chest pain but that it improved when he restarted it, however despite that he continues to have pain and requires short acting nitroglycerin for some relief. He says that some of his symptoms feel like his angina in the past. He's been established in our pacemaker clinic and had a check in December with Dr. Lovena Le however wishes to switch his follow-up to [facility].  11/18/2016  Freyre was seen today in follow-up. Over the past month he had recurrent anginal symptoms despite increasing his isosorbide and wish to have heart catheterization. He underwent left heart catheterization by Dr. Martinique on 10/20/2016 which showed a 20% mid-LAD stenosis normal LV systolic function, normal LVEDP and a patent stent. Despite this he continued to have chest discomfort and was started on low-dose amlodipine 2.5 mg. Initially note some relief in this and does report that his blood pressure is much better controlled however he still having symptoms. He said yesterday he felt horrible with a symptoms including dizziness, nausea, imbalance, a posterior neck base headache and pain that radiated down the left arm. He's also had some gait difficulty and reports recently having some word finding difficulty.  This concerns me of about a possible neurologic cause of his symptoms and perhaps is pain is a neuropathic or chest wall pain. At this point it's unlikely this is anginal pain.  02/16/2017  Mr. Exley returns today for follow-up.  He reports he is done well over the past several months.  He denies any new anginal symptoms.  He is not needed any short acting nitroglycerin, but is in need of a refill.  Blood pressure is well controlled today.  He denies any palpitations.  He did follow-up with the neurologist Dr. Delice Lesch, who recommended neurocognitive testing and an MRI.  Unfortunately has not been scheduled.  I have asked them  to reach out to neurology to see why this is been delayed.  He has a follow-up with her in March.  He is also scheduled to have a pacemaker check with Dr. Loletha Grayer on December 12.  PMHx:  Past Medical History:  Diagnosis Date  . Asthma   . 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 Coronary artery disease involving native coronary artery of native heart with unstable angina pectoris ([place]) 02/28/2016  . Essential hypertension 02/28/2016  . GERD (gastroesophageal reflux disease)   . HLD (hyperlipidemia)   . 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 OSA treated with BiPAP   . PAF (paroxysmal atrial fibrillation) ([place]) 02/28/2016  . PCO (posterior capsular opacification)   . Pseudophakia of both eyes   . S/P bilateral cataract extraction 11/21/2013  . S/P placement of cardiac pacemaker 02/28/2016   Medtronic  . S/P primary angioplasty with coronary stent 02/28/2016  . Secondary corneal edema of left eye   . Status post corneal transplant   . Thyroid disease     Past Surgical History:  Procedure Laterality Date  . APPENDECTOMY    . AQUEOUS SHUNT EXTRAOCULAR Left 09/18/2013   Elenore Rota L. Pennie Banter, MD [facility]  . CARDIAC CATHETERIZATION  03/07/2014  . CATARACT EXTRACTION Bilateral   . CATARACT EXTRACTION W/ INTRAOCULAR LENS IMPLANT Left 01/2012   [place]Cu[place], MD  . CATARACT EXTRACTION W/ INTRAOCULAR LENS IMPLANT Right 2003   [facility]  . CORNEAL TRANSPLANT Left 01/17/2015   Keratoplasty, Endothelial. Nelma Rothman, MD [place]   . DESCEMETS STRIPPING AUTOMATED ENDOTHELIAL KERATOPLASTY Left 04/2012   Marilynne Halsted, MD;  . GLAUCOMA SURGERY Left 09/18/2013   [place] BG-101 implant with cornea graft  . Left Heart Cath and Coronary Angiography N/A 10/20/2016   Performed by Martinique, Peter M, MD at [facility]  . PACEMAKER INSERTION  09/2014   Done at [facility].   [place] Levo REINFORCEMENT Left 09/18/2013   with graft, Edson Snowball, MD, [facility]  . WISDOM TOOTH EXTRACTION       FAMHx:  Family History  Problem Relation Age of Onset  . Colon cancer Mother   . Hypertension Mother   . Kidney disease Father   . CAD Father   . Stroke Father   . Spina bifida Sister   . Thyroid disease Sister   . Diabetes Brother   . Retinal detachment Brother     SOCHx:   reports that  has never smoked. he has never used smokeless tobacco. He reports that he drinks about 0.6 oz of alcohol per week. He reports that he does not use drugs.  ALLERGIES:  Allergies  Allergen Reactions  . Timolol Anaphylaxis and Other (See Comments)    Cardiac stand still  Stops heart  Pt reports hx severe bradycardia after long term  use of timolol Cardiac stand still  Stops heart   . Other Other (See Comments)    Beta Blockers-Timolol caused black outs    ROS: Pertinent items noted in HPI and remainder of comprehensive ROS otherwise negative.  HOME MEDS: Current Outpatient Medications on File Prior to Visit  Medication Sig Dispense Refill  . amLODipine (NORVASC) 2.5 MG tablet Take 2.5 mg every morning 90 tablet 3  . aspirin 325 MG tablet Take 325 mg by mouth daily.    Marland Kitchen atorvastatin (LIPITOR) 40 MG tablet Take 1 tablet (40 mg total) by mouth daily. 90 tablet 1  . Brinzolamide-Brimonidine 1-0.2 % SUSP Apply 1 drop to eye 2 (two) times daily.     . cloNIDine (CATAPRES) 0.1 MG tablet Pt takes only if blood pressure goes over 150  2  . ezetimibe (ZETIA) 10 MG tablet Take 10 mg by mouth daily.    . fluticasone (FLONASE) 50 MCG/ACT nasal spray Place 1 spray into both nostrils daily as needed for allergies or rhinitis.    Marland Kitchen isosorbide mononitrate (IMDUR) 60 MG 24 hr tablet Take 1 tablet (60mg ) by mouth in the morning and 1/2 tablet (30mg ) in the evening. 135 tablet 3  . levothyroxine (SYNTHROID, LEVOTHROID) 100 MCG tablet Take 100 mcg by mouth daily before breakfast.    . liraglutide (VICTOZA) 18 MG/3ML SOPN Inject 0.3 mLs (1.8 mg total) into the skin daily.    . metoprolol succinate  (TOPROL-XL) 50 MG 24 hr tablet Take 1 tablet (50 mg total) by mouth 2 (two) times daily. 60 tablet 12  . nitroGLYCERIN (NITROSTAT) 0.4 MG SL tablet DISSOLVE ONE TABLET UNDER THE TONGUE EVERY 5 MINUTES AS NEEDED FOR CHEST PAIN.  DO NOT EXCEED A TOTAL OF 3 DOSES IN 15 MINUTES NOW 25 tablet 0  . omeprazole (PRILOSEC) 40 MG capsule Take 1 capsule (40 mg total) by mouth 2 (two) times daily. 180 capsule 3  . pilocarpine (PILOCAR) 2 % ophthalmic solution Place 1 drop into the right eye 4 (four) times daily.    . prasugrel (EFFIENT) 10 MG TABS tablet Take 1 tablet (10 mg total) by mouth daily. 90 tablet 2  . prednisoLONE acetate (PRED FORTE) 1 % ophthalmic suspension Place 1 drop into the left eye 3 (three) times daily.    Marland Kitchen PRESCRIPTION MEDICATION ByPap    . RANEXA 500 MG 12 hr tablet TAKE 2 TABLETS BY MOUTH TWICE DAILY 180 tablet 3  . tadalafil (CIALIS) 5 MG tablet Take 5 mg by mouth as needed for erectile dysfunction.    Marland Kitchen testosterone cypionate (DEPOTESTOTERONE CYPIONATE) 100 MG/ML injection Inject 100 mg into the muscle every 14 (fourteen) days. For IM use only     No current facility-administered medications on file prior to visit.     LABS/IMAGING: No results found for this or any previous visit (from the past 48 hour(s)). No results found.  WEIGHTS: Wt Readings from Last 3 Encounters:  02/16/17 262 lb 3.2 oz (118.9 kg)  01/25/17 268 lb (121.6 kg)  11/18/16 269 lb 12.8 oz (122.4 kg)    VITALS: BP 118/70   Pulse 69   Ht 5\' 10"  (1.778 m)   Wt 262 lb 3.2 oz (118.9 kg)   BMI 37.62 kg/m   EXAM: General appearance: alert and no distress Neck: no carotid bruit, no JVD and thyroid not enlarged, symmetric, no tenderness/mass/nodules Lungs: clear to auscultation bilaterally Heart: regular rate and rhythm Abdomen: soft, non-tender; bowel sounds normal; no masses,  no organomegaly  and obese Extremities: extremities normal, atraumatic, no cyanosis or edema Pulses: 2+ and symmetric Skin:  Skin color, texture, turgor normal. No rashes or lesions Neurologic: Grossly normal Psych: Pleasant  EKG: Normal sinus rhythm at 69, minute moderate voltage criteria for LVH, nonspecific T wave changes-personally reviewed  ASSESSMENT: 1. Chronic chest pain, dizziness, headache, word finding difficulty, left arm pain 2. Coronary artery disease with persistent angina 3. Myocardial bridge status post PCI in 02/2014 with a Promus Premier DES 4. History of SVT with ablation and need for subsequent PPM 5. S/p PPM (Medtronic) 6. Dyslipidemia 7. Hypertension 8. OSA on BIPAP  PLAN: 1.   Mr. Davie seems to be doing well without any new symptoms.  He denies any angina is not used any short-acting nitroglycerin.  He is scheduled for pacemaker follow-up next month.  He is undergoing a workup with neurology for issues with word finding difficulty and needs to contact them to schedule MRI and neurocognitive testing as requested.  No changes to his medications today.  We will refill his nitroglycerin.  Follow-up with me in 6 months.   Pixie Casino, MD, [facility], [place] Director of the Advanced Lipid Disorders &  Cardiovascular Risk Reduction Clinic Attending Cardiologist  Direct Dial: [phone]  Fax: [phone]  Website:  [email or web address] 02/16/2017, 8:33 AM

## 2017-02-16 NOTE — Telephone Encounter (Signed)
Patient wife called and states that we were going to set him up for some test MRI and they couldn't tell me the other one. They have not heard from anyone and wants to know the staus

## 2017-02-16 NOTE — Patient Instructions (Signed)
Your physician recommends that you continue on your current medications as directed. Please refer to the Current Medication list given to you today.  Your physician wants you to follow-up in: 6 months with Dr. Hilty. You will receive a reminder letter in the mail two months in advance. If you don't receive a letter, please call our office to schedule the follow-up appointment.  

## 2017-02-23 ENCOUNTER — Ambulatory Visit: Payer: BLUE CROSS/BLUE SHIELD | Admitting: Physical Therapy

## 2017-02-23 NOTE — Telephone Encounter (Signed)
Patient's wife has not heard from anyone regarding setting up an MRI. Please Call. Thanks

## 2017-02-23 NOTE — Telephone Encounter (Signed)
Called [facility] scheduling to schedule pt's MRI.  Due to pt having pacemaker, they require make, model and serial number prior to scheduling.    Talked with pt and his wife, explained that [facility] needs above information.  Pt's wife took scheduling phone number and will call when they have the information available to them.

## 2017-03-03 ENCOUNTER — Encounter: Payer: BLUE CROSS/BLUE SHIELD | Admitting: Cardiovascular Disease

## 2017-03-07 ENCOUNTER — Other Ambulatory Visit: Payer: Self-pay | Admitting: Internal Medicine

## 2017-03-10 ENCOUNTER — Encounter: Payer: Self-pay | Admitting: Cardiovascular Disease

## 2017-03-10 ENCOUNTER — Encounter: Payer: Self-pay | Admitting: Rehabilitative and Restorative Service Providers"

## 2017-03-10 ENCOUNTER — Ambulatory Visit: Payer: BLUE CROSS/BLUE SHIELD | Attending: Neurology | Admitting: Rehabilitative and Restorative Service Providers"

## 2017-03-10 ENCOUNTER — Ambulatory Visit (INDEPENDENT_AMBULATORY_CARE_PROVIDER_SITE_OTHER): Payer: BLUE CROSS/BLUE SHIELD | Admitting: Cardiovascular Disease

## 2017-03-10 VITALS — BP 88/60 | HR 78 | Ht 70.0 in | Wt 258.0 lb

## 2017-03-10 DIAGNOSIS — Z6837 Body mass index (BMI) 37.0-37.9, adult: Secondary | ICD-10-CM | POA: Diagnosis not present

## 2017-03-10 DIAGNOSIS — R293 Abnormal posture: Secondary | ICD-10-CM

## 2017-03-10 DIAGNOSIS — Z9861 Coronary angioplasty status: Secondary | ICD-10-CM | POA: Diagnosis not present

## 2017-03-10 DIAGNOSIS — I495 Sick sinus syndrome: Secondary | ICD-10-CM | POA: Diagnosis not present

## 2017-03-10 DIAGNOSIS — R2689 Other abnormalities of gait and mobility: Secondary | ICD-10-CM | POA: Diagnosis present

## 2017-03-10 DIAGNOSIS — M542 Cervicalgia: Secondary | ICD-10-CM | POA: Diagnosis present

## 2017-03-10 DIAGNOSIS — Z950 Presence of cardiac pacemaker: Secondary | ICD-10-CM

## 2017-03-10 DIAGNOSIS — E782 Mixed hyperlipidemia: Secondary | ICD-10-CM | POA: Diagnosis not present

## 2017-03-10 DIAGNOSIS — I471 Supraventricular tachycardia: Secondary | ICD-10-CM

## 2017-03-10 DIAGNOSIS — I2510 Atherosclerotic heart disease of native coronary artery without angina pectoris: Secondary | ICD-10-CM

## 2017-03-10 DIAGNOSIS — I4719 Other supraventricular tachycardia: Secondary | ICD-10-CM

## 2017-03-10 DIAGNOSIS — R42 Dizziness and giddiness: Secondary | ICD-10-CM | POA: Diagnosis present

## 2017-03-10 DIAGNOSIS — E66812 Obesity, class 2: Secondary | ICD-10-CM

## 2017-03-10 NOTE — Patient Instructions (Signed)
Gaze Stabilization - Tip Card  1.Target must remain in focus, not blurry, and appear stationary while head is in motion. 2.Perform exercises with small head movements (45 to either side of midline). 3.Increase speed of head motion so long as target is in focus. 4.If you wear eyeglasses, be sure you can see target through lens (therapist will give specific instructions for bifocal / progressive lenses). 5.These exercises may provoke dizziness or nausea. Work through these symptoms. If too dizzy, slow head movement slightly. Rest between each exercise. 6.Exercises demand concentration; avoid distractions. 7.For safety, perform standing exercises close to a counter, wall, corner, or next to someone.  Copyright  VHI. All rights reserved.   Gaze Stabilization - Standing Feet Apart   Feet shoulder width apart, keeping eyes on target on wall 3 feet away, tilt head down slightly and move head side to side for 30 seconds. Repeat while moving head up and down for 30 seconds. *Work up to tolerating 60 seconds, as able. Do 2-3 sessions per day.   Copyright  VHI. All rights reserved.   Thoracic Self-Mobilization (Supine)    With rolled towel placed lengthwise at lower ribs level, lie back on towel with arms outstretched. Hold _2 minutes. Relax. Repeat __1-2__ times per day as needed for postural stretching.  http://orth.exer.us/1001   Copyright  VHI. All rights reserved.

## 2017-03-10 NOTE — Progress Notes (Signed)
Cardiology Office Note:    Date:  03/10/2017   ID:  Mykell Rawl, DOB 1955-07-12, MRN [id number]  PCP:  Druscilla Brownie, PA-C  Cardiologist: Kenneth Mali Hilty, MD; Sanda Klein, MD    Referring MD: Druscilla Brownie, PA-C   chief complaint: Establish pacemaker follow-up  History of Present Illness:    Thomas Walsh is a 61 y.o. 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  He saw Dr. Lovena Le at [facility] but would like his care centralized at the of [facility]. location.  His last coronary revascularization procedure with balloon angioplasty for in-stent restenosis in the mid LAD in February 2017.  He is physically very active performing sometimes intense physical work without complaints of angina, dyspnea, dizziness, syncope or palpitations.  He does not have leg edema or claudication. Reports compliance with CPAP for OSA.  In the past he had a high burden of atrial pacing but after adjustment in his medications he now has a much lower percentage of atrial pacing.  He does not require ventricular pacing.  Interrogation of his device today shows that his Medtronic advisa DR MRI conditional device is functioning normally.  He has atrial and ventricular 5076 leads which are also MRI compatible.  Lead parameters are excellent.  He has roughly 19% atrial pacing and less than 0.1% ventricular pacing.  At this point device longevity is estimated at about 8 years.  Activity level is very high with an average of 7.7 hours a day.  Past Medical History:  Diagnosis Date  . Asthma   . 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 Coronary artery disease involving native coronary artery of native heart with unstable angina pectoris ([place]) 02/28/2016  . Essential hypertension 02/28/2016  . GERD (gastroesophageal reflux disease)   . HLD  (hyperlipidemia)   . 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 OSA treated with BiPAP   . PAF (paroxysmal atrial fibrillation) ([place]) 02/28/2016  . PCO (posterior capsular opacification)   . Pseudophakia of both eyes   . S/P bilateral cataract extraction 11/21/2013  . S/P placement of cardiac pacemaker 02/28/2016   Medtronic  . S/P primary angioplasty with coronary stent 02/28/2016  . Secondary corneal edema of left eye   . Status post corneal transplant   . Thyroid disease     Past Surgical History:  Procedure Laterality Date  . APPENDECTOMY    . AQUEOUS SHUNT EXTRAOCULAR Left 09/18/2013   Elenore Rota L. Pennie Banter, MD [facility]  . CARDIAC CATHETERIZATION  03/07/2014  . CATARACT EXTRACTION Bilateral   . CATARACT EXTRACTION W/ INTRAOCULAR LENS IMPLANT Left 01/2012   McCuen, MD  . CATARACT EXTRACTION W/ INTRAOCULAR LENS IMPLANT Right 2003   [facility]  . CORNEAL TRANSPLANT Left 01/17/2015   Keratoplasty, Endothelial. Nelma Rothman, MD [place]   . DESCEMETS STRIPPING AUTOMATED ENDOTHELIAL KERATOPLASTY Left 04/2012   Marilynne Halsted, MD;  . GLAUCOMA SURGERY Left 09/18/2013   Baerveldt BG-101 implant with cornea graft  . LEFT HEART CATH AND CORONARY ANGIOGRAPHY N/A 10/20/2016   Procedure: Left Heart Cath and Coronary Angiography;  Surgeon: Martinique, Peter M, MD;  Location: [facility];  Service: Cardiovascular;  Laterality: N/A;  . PACEMAKER INSERTION  09/2014   Done at [facility].   Norwood Levo REINFORCEMENT Left 09/18/2013   with graft, Edson Snowball, MD, [facility]  EXTRACTION      Current Medications: Current Meds  Medication Sig  . amLODipine (NORVASC) 2.5 MG tablet Take 2.5 mg every morning  . aspirin 325 MG tablet Take 325 mg by mouth daily.  Marland Kitchen atorvastatin (LIPITOR) 40 MG tablet Take 1 tablet (40 mg total) by mouth daily.  . Brinzolamide-Brimonidine 1-0.2 % SUSP Apply 1 drop to eye 2 (two) times daily.   . cloNIDine (CATAPRES) 0.1 MG tablet Pt  takes only if blood pressure goes over 150  . ezetimibe (ZETIA) 10 MG tablet Take 10 mg by mouth daily.  . fluticasone (FLONASE) 50 MCG/ACT nasal spray Place 1 spray into both nostrils daily as needed for allergies or rhinitis.  Marland Kitchen isosorbide mononitrate (IMDUR) 60 MG 24 hr tablet Take 1 tablet (60mg ) by mouth in the morning and 1/2 tablet (30mg ) in the evening.  Marland Kitchen levothyroxine (SYNTHROID, LEVOTHROID) 100 MCG tablet Take 100 mcg by mouth daily before breakfast.  . liraglutide (VICTOZA) 18 MG/3ML SOPN Inject 0.3 mLs (1.8 mg total) into the skin daily.  . metoprolol succinate (TOPROL-XL) 50 MG 24 hr tablet TAKE 1 TABLET BY MOUTH TWICE DAILY  . nitroGLYCERIN (NITROSTAT) 0.4 MG SL tablet Place 1 tablet (0.4 mg total) every 5 (five) minutes as needed under the tongue for chest pain. MAX 3 doses  . omeprazole (PRILOSEC) 40 MG capsule Take 1 capsule (40 mg total) by mouth 2 (two) times daily.  . pilocarpine (PILOCAR) 2 % ophthalmic solution Place 1 drop into the right eye 4 (four) times daily.  . prasugrel (EFFIENT) 10 MG TABS tablet Take 1 tablet (10 mg total) by mouth daily.  . prednisoLONE acetate (PRED FORTE) 1 % ophthalmic suspension Place 1 drop into the left eye 3 (three) times daily.  Marland Kitchen PRESCRIPTION MEDICATION ByPap  . RANEXA 500 MG 12 hr tablet TAKE 2 TABLETS BY MOUTH TWICE DAILY  . tadalafil (CIALIS) 5 MG tablet Take 5 mg by mouth as needed for erectile dysfunction.  Marland Kitchen testosterone cypionate (DEPOTESTOTERONE CYPIONATE) 100 MG/ML injection Inject 100 mg into the muscle every 14 (fourteen) days. For IM use only     Allergies:   Timolol and Other   Social History   Socioeconomic History  . Marital status: Married    Spouse name: None  . Number of children: None  . Years of education: None  . Highest education level: None  Social Needs  . Financial resource strain: None  . Food insecurity - worry: None  . Food insecurity - inability: None  . Transportation needs - medical: None  .  Transportation needs - non-medical: None  Occupational History  . Occupation: hvac   Tobacco Use  . Smoking status: Never Smoker  . Smokeless tobacco: Never Used  Substance and Sexual Activity  . Alcohol use: Yes    Alcohol/week: 0.6 oz    Types: 1 Shots of liquor per week  . Drug use: No  . Sexual activity: None  Other Topics Concern  . None  Social History Narrative  . None     Family History: The patient's family history includes CAD in his father; Colon cancer in his mother; Diabetes in his brother; Hypertension in his mother; Kidney disease in his father; Retinal detachment in his brother; Spina bifida in his sister; Stroke in his father; Thyroid disease in his sister. ROS:   Please see the history of present illness.     All other systems reviewed and are negative.  EKGs/Labs/Other Studies Reviewed:    The  following studies were reviewed today: Notes from Dr. Debara Pickett and Dr. Lovena Le  EKG:  EKG is not ordered today.  The ekg ordered February 16, 2017 demonstrates sinus rhythm, incomplete criteria for LVH, nonspecific T wave changes  Recent Labs: 10/20/2016: BUN 12; Creatinine, Ser 1.19; Hemoglobin 17.5; Platelets 156; Potassium 4.1; Sodium 138  Recent Lipid Panel    Component Value Date/Time   CHOL 115 02/28/2016 1119   TRIG 150 (H) 02/28/2016 1119   HDL 33 (L) 02/28/2016 1119   CHOLHDL 3.5 02/28/2016 1119   VLDL 30 02/28/2016 1119   LDLCALC 52 02/28/2016 1119    Physical Exam:    VS:  BP (!) 88/60   Pulse 78   Ht 5\' 10"  (1.778 m)   Wt 258 lb (117 kg)   BMI 37.02 kg/m     Wt Readings from Last 3 Encounters:  03/10/17 258 lb (117 kg)  02/16/17 262 lb 3.2 oz (118.9 kg)  01/25/17 268 lb (121.6 kg)     GEN: Obese but also very muscular, well nourished, well developed in no acute distress HEENT: Normal NECK: No JVD; No carotid bruits LYMPHATICS: No lymphadenopathy CARDIAC: RRR, no murmurs, rubs, gallops healthy left subclavian pacemaker site,  RESPIRATORY:   Clear to auscultation without rales, wheezing or rhonchi  ABDOMEN: Soft, non-tender, non-distended MUSCULOSKELETAL:  No edema; No deformity  SKIN: Warm and dry NEUROLOGIC:  Alert and oriented x 3 PSYCHIATRIC:  Normal affect   ASSESSMENT:    1. Sinus bradycardia-tachycardia syndrome (HCC)   2. Cardiac pacemaker in situ   3. AVNRT (AV nodal re-entry tachycardia) ([place])   4. CAD S/P percutaneous coronary angioplasty   5. Hyperlipidemia, mixed   6. Class 2 severe obesity due to excess calories with serious comorbidity and body mass index (BMI) of 37.0 to 37.9 in adult [facility])    PLAN:    In order of problems listed above:  1. SSS: Questionable diagnosis.  Suspect his bradycardia was primarily drug-induced.  He is clearly not pacemaker dependent. 2. PPM: Normal device function.  His device is MRI conditional and MRI scans can be performed with appropriate precautions and device reprogramming.  Since he lives far away, will limit ourselves to remote downloads every 3 months.  He will follow-up in person with Dr. Debara Pickett at least yearly. 3. AVNRT: With previous ablation procedure.  Some question whether the ablation was successful, no recurrence is seen on his device.  4. CAD: Currently asymptomatic despite a very active lifestyle.  On multiple antianginal medications including long-acting nitrates, calcium channel blocker, beta-blocker, ranexa. 5. HLP: Excellent LDL cholesterol with low HDL.  The latter is unlikely to improve without substantial weight loss.  Elevated triglycerides and prominent abdominal obesity suggest that he has metabolic syndrome/insulin resistance and is at risk for vascular complications and development of full-blown diabetes mellitus.  Weight loss strongly recommended.  He is already very physically active and fit.   Medication Adjustments/Labs and Tests Ordered: Current medicines are reviewed at length with the patient today.  Concerns regarding medicines are outlined  above.  No orders of the defined types were placed in this encounter.  No orders of the defined types were placed in this encounter.   Signed, Sanda Klein, MD  03/10/2017 11:39 AM    [place]

## 2017-03-10 NOTE — Therapy (Signed)
[facility] [address] [place], [place], [address] Phone: [phone]   Fax:  [phone]  Physical Therapy Evaluation  Patient Details  Name: Thomas Walsh MRN: [id number] Date of Birth: [birth year] Referring Provider: Ellouise Newer, MD   Encounter Date: 03/10/2017  PT End of Session - 03/10/17 1238    Visit Number  1    Number of Visits  12    Date for PT Re-Evaluation  05/09/17    Authorization Type  BCBS 30 visit limit    PT Start Time  1154    PT Stop Time  1235    PT Time Calculation (min)  41 min    Activity Tolerance  Patient tolerated treatment well    Behavior During Therapy  [facility] for tasks assessed/performed       Past Medical History:  Diagnosis Date  . Asthma   . 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 Coronary artery disease involving native coronary artery of native heart with unstable angina pectoris ([place]) 02/28/2016  . Essential hypertension 02/28/2016  . GERD (gastroesophageal reflux disease)   . HLD (hyperlipidemia)   . 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 OSA treated with BiPAP   . PAF (paroxysmal atrial fibrillation) ([employer]) 02/28/2016  . PCO (posterior capsular opacification)   . Pseudophakia of both eyes   . S/P bilateral cataract extraction 11/21/2013  . S/P placement of cardiac pacemaker 02/28/2016   Medtronic  . S/P primary angioplasty with coronary stent 02/28/2016  . Secondary corneal edema of left eye   . Status post corneal transplant   . Thyroid disease     Past Surgical History:  Procedure Laterality Date  . APPENDECTOMY    . AQUEOUS SHUNT EXTRAOCULAR Left 09/18/2013   Elenore Rota L. Pennie Banter, MD [facility]  . CARDIAC CATHETERIZATION  03/07/2014  . CATARACT EXTRACTION Bilateral   . CATARACT EXTRACTION W/ INTRAOCULAR LENS IMPLANT Left 01/2012   McCuen, MD  . CATARACT EXTRACTION W/ INTRAOCULAR LENS IMPLANT Right 2003   [facility]  . CORNEAL TRANSPLANT Left 01/17/2015   Keratoplasty, Endothelial. Nelma Rothman, MD [place]   . DESCEMETS STRIPPING AUTOMATED ENDOTHELIAL KERATOPLASTY Left 04/2012   Marilynne Halsted, MD;  . GLAUCOMA SURGERY Left 09/18/2013   Baerveldt BG-101 implant with cornea graft  . LEFT HEART CATH AND CORONARY ANGIOGRAPHY N/A 10/20/2016   Procedure: Left Heart Cath and Coronary Angiography;  Surgeon: Martinique, Peter M, MD;  Location: [facility];  Service: Cardiovascular;  Laterality: N/A;  . PACEMAKER INSERTION  09/2014   Done at [facility].   Norwood Levo REINFORCEMENT Left 09/18/2013   with graft, Edson Snowball, MD, [facility]  . WISDOM TOOTH EXTRACTION      There were no vitals filed for this visit.   Subjective Assessment - 03/10/17 1157    Subjective  The patient notes sudden onset of room spinning dizziness with rolling in bed in October 2018.  Symptoms lasted x 2 minutes and were accompanied by nausea.  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   He notes symptoms are still there, but not as severe.  He now feels imbalance has worsened.  He denies vision changes, notes tinnitus in both ears (constant), occasional nausea, headaches ("I keep a headache" with HA worse since summer 2018) describing a posterior/cervical headache.  He also has new onset of neck pain reporting he has to lay flat without a pillow.  Notes cramping in his hands with using tools.    MRI has been ordered, but patient notes not yet performed due to clearance needed due to pacemaker.    Pertinent History  HTN, CAD, pacemaker, thyroid disease    Patient Stated Goals  Patient states his goal is to reduce nausea (currently feels nausea 3-4 days/week)- depends on activity level increases with dizziness and activity.    Currently in Pain?  Yes "stiffness"    Pain Score  5     Pain Location  Neck upper back and headache    Pain Orientation  -- upper back, neck, posterior head pain    Pain  Descriptors / Indicators  Headache;Aching    Pain Type  Chronic pain    Pain Onset  More than a month ago    Pain Frequency  Constant    Aggravating Factors   Headache constant    Pain Relieving Factors  unsure- "pretty constant"         [facility] PT Assessment - 03/10/17 1209      Assessment   Medical Diagnosis  dizziness, headaches, neck pain    Referring Provider  Ellouise Newer, MD    Onset Date/Surgical Date  -- October 2018    Prior Therapy  Symptoms worsened beginning in October      Precautions   Precautions  None      Restrictions   Weight Bearing Restrictions  No      Balance Screen   Has the patient fallen in the past 6 months  No    Has the patient had a decrease in activity level because of a fear of falling?   Yes due to dizziness, not getting on ladders    Is the patient reluctant to leave their home because of a fear of falling?   No      Home Environment   Living Environment  Private residence    Type of [place] to enter    Entrance Stairs-Number of Steps  2    [place]  One level      Prior Function   Level of Independence  Independent    Vocation  Part time employment 25 hours/week     Vocation Requirements  works in [employer], community and [employer]      Observation/Other Assessments   Focus on Therapeutic Outcomes (FOTO)   61% functional status survey         Vestibular Assessment - 03/10/17 1211      Vestibular Assessment   General Observation  *Awaiting MRI-- reports held up due to needing further information re: pacemaker.  The patient has h/o eye surgeries beginning at birth.  Notes L eye shunt, cateracts surgeries bilateral, has glaucoma L eye since birth.  Recent diagnosis of neuropathy.        Occulomotor Exam   Occulomotor Alignment  Abnormal    Spontaneous  Absent    Gaze-induced  Absent    Smooth Pursuits  Comment see below*    Saccades  -- With R gaze, L eye does not move with R eye-lags in movemen    Comment  In right  gaze, the left eye has more asymmetry with movement.  He notes no depth perception since a recent eye surgery.       Vestibulo-Occular Reflex   VOR 1 Head Only (x 1 viewing)  VOR x 1 viewing at slow pace able to maintain fixation  Comment  NECK AROM:  R rotation 62 deg, L rotation 55 deg, R sidebending 24 deg, L sidebending 27 degrees, neck flexion 38 deg, neck extension 30 deg.   *Looking up increases dizziness*.    HEAD IMPULSE TEST: Unable to maintain gaze fixation during small, slow head movements done passively.  Patient loses fixation on target.       Positional Testing   Dix-Hallpike  Dix-Hallpike Right;Dix-Hallpike Left    Sidelying Test  Sidelying Right;Sidelying Left    Horizontal Canal Testing  Horizontal Canal Right;Horizontal Canal Left      Dix-Hallpike Right   Dix-Hallpike Right Duration  *modified position as patient couldn't tolerate neck extension due to neck pain.  Patient gets to neutral extension.    Dix-Hallpike Right Symptoms  No nystagmus      Dix-Hallpike Left   Dix-Hallpike Left Duration  *modified for neck position    Dix-Hallpike Left Symptoms  No nystagmus      Sidelying Right   Sidelying Right Duration  Increases headache pain moving it to the front of his head    Sidelying Right Symptoms  No nystagmus      Sidelying Left   Sidelying Left Duration  *returning to sit provokes a sensation that head and body are moving different and "need to catch up" with each other.  No symptoms to the left side.      Sidelying Left Symptoms  No nystagmus      Horizontal Canal Right   Horizontal Canal Right Duration  none    Horizontal Canal Right Symptoms  Normal      Horizontal Canal Left   Horizontal Canal Left Duration  none    Horizontal Canal Left Symptoms  Normal         Objective measurements completed on examination: See above findings.      [facility] Adult PT Treatment/Exercise - 03/10/17 1235      Exercises   Exercises  Other Exercises    Other  Exercises   Supine towel roll stretch for posture aggravates symptoms.  Performed stretch without towel roll for HEP.       Vestibular Treatment/Exercise - 03/10/17 1229      Vestibular Treatment/Exercise   Vestibular Treatment Provided  Gaze    Gaze Exercises  X1 Viewing Horizontal      X1 Viewing Horizontal   Foot Position  standing     Comments  performed x 20 seconds with cues on maintaining fixation on target and being near support surface for balance.            PT Education - 03/10/17 1236    Education provided  Yes    Education Details  HEP: towel roll stretch (beginning without towel roll supine), VOR x 1 standing feet apart.    Person(s) Educated  Patient;Spouse    Methods  Explanation;Demonstration;Handout    Comprehension  Returned demonstration;Verbalized understanding       PT Short Term Goals - 03/10/17 2017      PT SHORT TERM GOAL #1   Title  The patient will be indep with HEP for neck stabilization, postural stretching, gaze x 1 adaptation and habituation as indicated.    Time  4    Period  Weeks    Target Date  04/09/17      PT SHORT TERM GOAL #2   Title  The patient will tolerate 30 seconds of gaze x 1 viewing without c/o visual blurring.    Time  4  Period  Weeks    Target Date  04/09/17      PT SHORT TERM GOAL #3   Title  The patient will subjectively report dizziness and nausea at rest 0/10.    Time  4    Period  Weeks    Target Date  04/09/17      PT SHORT TERM GOAL #4   Title  The patient will be further assessed on balance and LTG to follow, as indicated.    Time  6    Period  Weeks    Target Date  04/09/17        PT Long Term Goals - 03/10/17 2018      PT LONG TERM GOAL #1   Title  The patient will improve functional status survey score from 61% to > or equal to 72% for improved mobility.    Time  6    Period  Weeks    Target Date  05/09/17      PT LONG TERM GOAL #2   Title  The patient will return demo HEP progression for  post d/c activities.    Time  6    Period  Weeks    Target Date  05/09/17      PT LONG TERM GOAL #3   Title  The patient will improve Cervical AROM L rotation from 55 degrees to > or equal to 62 degrees.     Time  6    Period  Weeks    Target Date  05/09/17      PT LONG TERM GOAL #4   Title  The patient will report no dizziness with looking up/cervical extension.    Time  6    Period  Weeks    Target Date  05/09/17      PT LONG TERM GOAL #5   Title  The patient will report neck and upper back pain to < or equal to 2/10.    Time  6    Period  Weeks    Target Date  05/09/17             Plan - 03/10/17 2021    Clinical Impression Statement  The patient is a 61 year old male presenting to OP PT with 2 month history of dizziness, imbalance, change in headache pattern (now posterior), neck and upper back pain.  He was referred for vestibular rehab and demonstrates diminished VOR with visual blurring and decreased fixation during head impulse testing.  He has h/o multiple eye surgeries with R eye dominant and lacks depth perception.  He also has motion sensitivity with looking up and reports imbalance.  Patient recently dx with peripheral neuropathy.  Suspect balance issues may be a multi-sensory deficit due to neuropathy, h/o chronic visual changes, and recent vestibular issues.  PT to address deficits and will also include treatment for neck and upper back as tolerated while awaiting MRI.      History and Personal Factors relevant to plan of care:  h/o visual changes, neuropathy, limited ROM neck, headaches.    Clinical Presentation  Evolving    Clinical Presentation due to:  worsening headaches, neck pain and upper back discomfort    Clinical Decision Making  Moderate    Rehab Potential  Good    PT Frequency  2x / week    PT Duration  6 weeks       Patient will benefit from skilled therapeutic intervention in order to improve the following  deficits and impairments:  Abnormal  gait, Decreased balance, Dizziness, Pain, Impaired flexibility, Postural dysfunction, Hypomobility  Visit Diagnosis: Dizziness and giddiness  Other abnormalities of gait and mobility  Neck pain  Abnormal posture     Problem List Patient Active Problem List   Diagnosis Date Noted  . Sinus bradycardia-tachycardia syndrome ([place]) 03/10/2017  . Dizziness 11/18/2016  . Left arm numbness 11/18/2016  . Nonintractable headache 11/18/2016  . Word finding difficulty 11/18/2016  . Chronic chest pain 10/20/2016  . Cardiac pacemaker in situ 04/03/2016  . Palpitations 04/03/2016  . High cholesterol 03/05/2016  . OSA (obstructive sleep apnea) 03/05/2016  . Sleep apnea 03/05/2016  . CAD S/P percutaneous coronary angioplasty 02/28/2016  . Essential hypertension 02/28/2016  . Atrial fibrillation, new onset ([place]) 02/28/2016  . S/P primary angioplasty with coronary stent 02/28/2016  . S/P placement of cardiac pacemaker 02/28/2016  . 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 Pre-diabetes 06/10/2014  . SOB (shortness of breath) on exertion 05/28/2014  . Adult BMI 30+ 05/24/2014  . Hyperlipidemia, mixed 05/24/2014  . 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 OSA on CPAP 05/24/2014  . Polycythemia 05/24/2014  . Status post bilateral cataract extraction 11/21/2013  . Secondary corneal edema of left eye 10/18/2013  . Status post corneal transplant 10/18/2013  . Asthma 09/15/2013  . GERD (gastroesophageal reflux disease) 09/15/2013  . Hypothyroidism 09/15/2013  . 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 History of corneal transplant 09/05/2013  . 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 Influenza A 03/06/2013  . 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 Corneal edema 12/23/2012  . PCO (posterior capsular opacification) 12/23/2012  . Pseudophakia 12/23/2012    Bristol Soy, PT 03/10/2017, 8:28 PM  [facility] [address] [place], [place], [address] Phone:  [phone]   Fax:  [phone]  Name: Thomas Walsh MRN: [id number] Date of Birth: 1956/01/15

## 2017-03-10 NOTE — Patient Instructions (Signed)
Remote monitoring is used to monitor your Pacemaker or ICD from home. This monitoring reduces the number of office visits required to check your device to one time per year. It allows [place] to keep an eye on the functioning of your device to ensure it is working properly. You are scheduled for a device check from home on Wednesday, March 13th, 2019. You may send your transmission at any time that day. If you have a wireless device, the transmission will be sent automatically. After your physician reviews your transmission, you will receive a notification with your next transmission date.  Dr Sallyanne Kuster will follow your pacemaker remotely.

## 2017-03-19 ENCOUNTER — Ambulatory Visit (HOSPITAL_COMMUNITY): Admission: RE | Admit: 2017-03-19 | Payer: BLUE CROSS/BLUE SHIELD | Source: Ambulatory Visit

## 2017-03-19 ENCOUNTER — Ambulatory Visit (HOSPITAL_COMMUNITY)
Admission: RE | Admit: 2017-03-19 | Discharge: 2017-03-19 | Disposition: A | Discharge status: Home / Self Care | Payer: BLUE CROSS/BLUE SHIELD | Source: Ambulatory Visit | Attending: Neurology | Admitting: Neurology

## 2017-03-19 ENCOUNTER — Ambulatory Visit: Payer: BLUE CROSS/BLUE SHIELD | Admitting: Rehabilitative and Restorative Service Providers"

## 2017-03-19 DIAGNOSIS — R42 Dizziness and giddiness: Secondary | ICD-10-CM | POA: Insufficient documentation

## 2017-03-19 DIAGNOSIS — M542 Cervicalgia: Secondary | ICD-10-CM

## 2017-03-19 DIAGNOSIS — R4789 Other speech disturbances: Secondary | ICD-10-CM | POA: Diagnosis present

## 2017-03-19 DIAGNOSIS — R51 Headache: Secondary | ICD-10-CM | POA: Diagnosis present

## 2017-03-19 DIAGNOSIS — R293 Abnormal posture: Secondary | ICD-10-CM

## 2017-03-19 DIAGNOSIS — G629 Polyneuropathy, unspecified: Secondary | ICD-10-CM | POA: Insufficient documentation

## 2017-03-19 DIAGNOSIS — M4802 Spinal stenosis, cervical region: Secondary | ICD-10-CM | POA: Insufficient documentation

## 2017-03-19 DIAGNOSIS — R519 Headache, unspecified: Secondary | ICD-10-CM

## 2017-03-19 DIAGNOSIS — R2689 Other abnormalities of gait and mobility: Secondary | ICD-10-CM

## 2017-03-19 NOTE — Therapy (Signed)
[facility] [address] [place], [place], [address] Phone: [phone]   Fax:  [phone]  Physical Therapy Treatment  Patient Details  Name: Thomas Walsh MRN: [id number] Date of Birth: 05-17-1955 Referring Provider: Ellouise Newer, MD   Encounter Date: 03/19/2017  PT End of Session - 03/19/17 1022    Visit Number  2    Number of Visits  12    Date for PT Re-Evaluation  05/09/17    Authorization Type  BCBS 30 visit limit    Authorization - Visit Number  2    Authorization - Number of Visits  30    PT Start Time  0940    PT Stop Time  1020    PT Time Calculation (min)  40 min    Activity Tolerance  Patient tolerated treatment well    Behavior During Therapy  [facility] for tasks assessed/performed       Past Medical History:  Diagnosis Date  . Asthma   . 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 Coronary artery disease involving native coronary artery of native heart with unstable angina pectoris ([place]) 02/28/2016  . Essential hypertension 02/28/2016  . GERD (gastroesophageal reflux disease)   . HLD (hyperlipidemia)   . 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 OSA treated with BiPAP   . PAF (paroxysmal atrial fibrillation) ([place]) 02/28/2016  . PCO (posterior capsular opacification)   . Pseudophakia of both eyes   . S/P bilateral cataract extraction 11/21/2013  . S/P placement of cardiac pacemaker 02/28/2016   Medtronic  . S/P primary angioplasty with coronary stent 02/28/2016  . Secondary corneal edema of left eye   . Status post corneal transplant   . Thyroid disease     Past Surgical History:  Procedure Laterality Date  . APPENDECTOMY    . AQUEOUS SHUNT EXTRAOCULAR Left 09/18/2013   Elenore Rota L. Pennie Banter, MD [facility]  . CARDIAC CATHETERIZATION  03/07/2014  . CATARACT EXTRACTION Bilateral   . CATARACT EXTRACTION W/ INTRAOCULAR LENS IMPLANT Left 01/2012   McCuen, MD  . CATARACT EXTRACTION W/ INTRAOCULAR LENS  IMPLANT Right 2003   [facility]  . CORNEAL TRANSPLANT Left 01/17/2015   Keratoplasty, Endothelial. Nelma Rothman, MD [place]   . DESCEMETS STRIPPING AUTOMATED ENDOTHELIAL KERATOPLASTY Left 04/2012   Marilynne Halsted, MD;  . GLAUCOMA SURGERY Left 09/18/2013   Baerveldt BG-101 implant with cornea graft  . LEFT HEART CATH AND CORONARY ANGIOGRAPHY N/A 10/20/2016   Procedure: Left Heart Cath and Coronary Angiography;  Surgeon: Martinique, Peter M, MD;  Location: [facility];  Service: Cardiovascular;  Laterality: N/A;  . PACEMAKER INSERTION  09/2014   Done at [facility].   Norwood Levo REINFORCEMENT Left 09/18/2013   with graft, Edson Snowball, MD, [facility]  . WISDOM TOOTH EXTRACTION      There were no vitals filed for this visit.  Subjective Assessment - 03/19/17 0941    Subjective  The patient has not had time to do HEP as often as recommended.  He is getting MRI today.   No dizziness or nausea today.     Patient Stated Goals  Patient states his goal is to reduce nausea (currently feels nausea 3-4 days/week)- depends on activity level increases with dizziness and activity.    Currently in Pain?  Yes "just tight", not pain                      [facility] Adult PT  Treatment/Exercise - 03/19/17 1002      Neuro Re-ed    Neuro Re-ed Details   Standing corner balance with eyes open and feet apart on compliant surfaces, standing head motion vertical modifying range of movement for improved tolerance.       Exercises   Exercises  Other Exercises    Other Exercises   Standing: chest opening in doorway with elbows bend for pec stretch bilateral, neural glide standing in doorway emphasizing stretching within tolerable range; supine chin tucks with tactile and demo cues x 10 reps, supine shoulder abduction to 90 degrees with right sided tightness more pronounced than L side.        Manual Therapy   Manual Therapy  Neural Stretch;Manual Traction;Soft tissue mobilization    Manual  therapy comments  for soft tissue lengthening and neural gliding    Soft tissue mobilization  bilateral scalenes and upper trap while performing L sidebending performed passive stretch with STM to R upper trap and reversed.     Manual Traction  gentle manual traction with suboccipital release    Neural Stretch  right side, then left side performing abduction of shoulder with wrist extension for nerve gliding.      Vestibular Treatment/Exercise - 03/19/17 0942      Vestibular Treatment/Exercise   Vestibular Treatment Provided  Gaze    Gaze Exercises  X1 Viewing Horizontal      X1 Viewing Horizontal   Foot Position  standing     Comments  performed x 20 seconds with cues on maintain fixation in smaller ROM due to neck tightness              PT Short Term Goals - 03/10/17 2017      PT SHORT TERM GOAL #1   Title  The patient will be indep with HEP for neck stabilization, postural stretching, gaze x 1 adaptation and habituation as indicated.    Time  4    Period  Weeks    Target Date  04/09/17      PT SHORT TERM GOAL #2   Title  The patient will tolerate 30 seconds of gaze x 1 viewing without c/o visual blurring.    Time  4    Period  Weeks    Target Date  04/09/17      PT SHORT TERM GOAL #3   Title  The patient will subjectively report dizziness and nausea at rest 0/10.    Time  4    Period  Weeks    Target Date  04/09/17      PT SHORT TERM GOAL #4   Title  The patient will be further assessed on balance and LTG to follow, as indicated.    Time  6    Period  Weeks    Target Date  04/09/17        PT Long Term Goals - 03/10/17 2018      PT LONG TERM GOAL #1   Title  The patient will improve functional status survey score from 61% to > or equal to 72% for improved mobility.    Time  6    Period  Weeks    Target Date  05/09/17      PT LONG TERM GOAL #2   Title  The patient will return demo HEP progression for post d/c activities.    Time  6    Period  Weeks     Target Date  05/09/17  PT LONG TERM GOAL #3   Title  The patient will improve Cervical AROM L rotation from 55 degrees to > or equal to 62 degrees.     Time  6    Period  Weeks    Target Date  05/09/17      PT LONG TERM GOAL #4   Title  The patient will report no dizziness with looking up/cervical extension.    Time  6    Period  Weeks    Target Date  05/09/17      PT LONG TERM GOAL #5   Title  The patient will report neck and upper back pain to < or equal to 2/10.    Time  6    Period  Weeks    Target Date  05/09/17            Plan - 03/19/17 1025    Clinical Impression Statement  The patient tolerated addition of home program for balance with vertical head motion and compliant surface standing.  He is limited by tightness in anterior chest musculature and has dec'd nerve gliding.  PT addressed through HEP.     PT Frequency  2x / week    PT Duration  6 weeks    PT Treatment/Interventions  ADLs/Self Care Home Management;Balance training;Neuromuscular re-education;Patient/family education;Functional mobility training;Therapeutic activities;Therapeutic exercise;Manual techniques;Passive range of motion;Vestibular    PT Next Visit Plan  check HEP, UE nerve gliding, scapular strengthening, postural stretching, balance training/multi-sensory *check imaging results    Consulted and Agree with Plan of Care  Patient       Patient will benefit from skilled therapeutic intervention in order to improve the following deficits and impairments:  Abnormal gait, Decreased balance, Dizziness, Pain, Impaired flexibility, Postural dysfunction, Hypomobility  Visit Diagnosis: Dizziness and giddiness  Other abnormalities of gait and mobility  Neck pain  Abnormal posture     Problem List Patient Active Problem List   Diagnosis Date Noted  . Sinus bradycardia-tachycardia syndrome ([place]) 03/10/2017  . Dizziness 11/18/2016  . Left arm numbness 11/18/2016  . Nonintractable  headache 11/18/2016  . Word finding difficulty 11/18/2016  . Chronic chest pain 10/20/2016  . Cardiac pacemaker in situ 04/03/2016  . Palpitations 04/03/2016  . High cholesterol 03/05/2016  . OSA (obstructive sleep apnea) 03/05/2016  . Sleep apnea 03/05/2016  . CAD S/P percutaneous coronary angioplasty 02/28/2016  . Essential hypertension 02/28/2016  . Atrial fibrillation, new onset ([place]) 02/28/2016  . S/P primary angioplasty with coronary stent 02/28/2016  . S/P placement of cardiac pacemaker 02/28/2016  . 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 Pre-diabetes 06/10/2014  . SOB (shortness of breath) on exertion 05/28/2014  . Adult BMI 30+ 05/24/2014  . Hyperlipidemia, mixed 05/24/2014  . 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 OSA on CPAP 05/24/2014  . Polycythemia 05/24/2014  . Status post bilateral cataract extraction 11/21/2013  . Secondary corneal edema of left eye 10/18/2013  . Status post corneal transplant 10/18/2013  . Asthma 09/15/2013  . GERD (gastroesophageal reflux disease) 09/15/2013  . Hypothyroidism 09/15/2013  . 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 History of corneal transplant 09/05/2013  . 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 Influenza A 03/06/2013  . 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 Corneal edema 12/23/2012  . PCO (posterior capsular opacification) 12/23/2012  . Pseudophakia 12/23/2012    Maguadalupe Lata, PT 03/19/2017, 10:27 AM  [facility] [address] [place], [place], [address] Phone: [phone]   Fax:  [phone]  Name: Thomas  Walsh MRN: [id number] Date of Birth: 1955/05/30

## 2017-03-19 NOTE — Patient Instructions (Addendum)
Gaze Stabilization - Tip Card  1.Target must remain in focus, not blurry, and appear stationary while head is in motion. 2.Perform exercises with small head movements (45 to either side of midline). 3.Increase speed of head motion so long as target is in focus. 4.If you wear eyeglasses, be sure you can see target through lens (therapist will give specific instructions for bifocal / progressive lenses). 5.These exercises may provoke dizziness or nausea. Work through these symptoms. If too dizzy, slow head movement slightly. Rest between each exercise. 6.Exercises demand concentration; avoid distractions. 7.For safety, perform standing exercises close to a counter, wall, corner, or next to someone.  Copyright  VHI. All rights reserved.   Gaze Stabilization - Standing Feet Apart   Feet shoulder width apart, keeping eyes on target on wall 3 feet away, tilt head down slightly and move head side to side for 30 seconds. Repeat while moving head up and down for 30 seconds. *Work up to tolerating 60 seconds, as able. Do 2-3 sessions per day.   Copyright  VHI. All rights reserved.   Thoracic Self-Mobilization (Supine)    With rolled towel placed lengthwise at lower ribs level, lie back on towel with arms outstretched. Hold _2 minutes. Relax. Repeat __1-2__ times per day as needed for postural stretching.  http://orth.exer.us/1001   Copyright  VHI. All rights reserved.          Electronically signed by Mervyn Gay, PT at 03/10/2017 12:36 PM   CHEST: Doorway, Bilateral - Standing    Standing in doorway, place hands on wall with elbows bent at shoulder height. Lean forward. Hold __20_ seconds. __3_ reps per set.  Extensors, Supine    Lie supine, head on small, rolled towel or one pillow.  Gently tuck chin and bring toward chest. Hold __5_ seconds. Repeat __10_ times per session. Do _2__ sessions per day.  Copyright  VHI. All rights reserved.    Copyright   VHI. All rights reserved.   Neurovascular: Radial Nerve Glide With Cervical Bias    Stand with left arm out to side, holding doorjamb, thumb down. Slowly move opposite side ear toward shoulder as far as possible without pain. Repeat _3__ times for 20 seconds.  Copyright  VHI. All rights reserved.     BALANCE:  Feet Apart, Head Motion - Eyes Open    With eyes open, feet apart, move head slowly: up and down 5 times.  *SMALL MOVEMENT TO BEGIN  Copyright  VHI. All rights reserved.   Feet Apart (Compliant Surface) Varied Arm Positions - Eyes Open    With eyes open, standing on compliant surface: __pillow_, feet shoulder width apart and arms AT YOUR SIDE, look at a stationary object. Hold __30__ seconds. Repeat _3___ times per session. Do _1-2___ sessions per day.  Copyright  VHI. All rights reserved.

## 2017-03-24 ENCOUNTER — Ambulatory Visit: Payer: BLUE CROSS/BLUE SHIELD | Admitting: Rehabilitative and Restorative Service Providers"

## 2017-03-24 DIAGNOSIS — R293 Abnormal posture: Secondary | ICD-10-CM

## 2017-03-24 DIAGNOSIS — R42 Dizziness and giddiness: Secondary | ICD-10-CM | POA: Diagnosis not present

## 2017-03-24 DIAGNOSIS — M542 Cervicalgia: Secondary | ICD-10-CM

## 2017-03-24 DIAGNOSIS — R2689 Other abnormalities of gait and mobility: Secondary | ICD-10-CM

## 2017-03-24 NOTE — Therapy (Signed)
[facility] [address] [place], [place], [address] Phone: [phone]   Fax:  [phone]  Physical Therapy Treatment  Patient Details  Name: Thomas Walsh MRN: [id number] Date of Birth: 1955-12-12 Referring Provider: Ellouise Newer, MD   Encounter Date: 03/24/2017  PT End of Session - 03/24/17 0905    Visit Number  3    Number of Visits  12    Date for PT Re-Evaluation  05/09/17    Authorization Type  BCBS 30 visit limit    Authorization - Visit Number  3    Authorization - Number of Visits  30    PT Start Time  0815    PT Stop Time  9833    PT Time Calculation (min)  42 min    Activity Tolerance  Patient tolerated treatment well    Behavior During Therapy  [facility] for tasks assessed/performed       Past Medical History:  Diagnosis Date  . Asthma   . 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 Coronary artery disease involving native coronary artery of native heart with unstable angina pectoris ([place]) 02/28/2016  . Essential hypertension 02/28/2016  . GERD (gastroesophageal reflux disease)   . HLD (hyperlipidemia)   . 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 OSA treated with BiPAP   . PAF (paroxysmal atrial fibrillation) ([place]) 02/28/2016  . PCO (posterior capsular opacification)   . Pseudophakia of both eyes   . S/P bilateral cataract extraction 11/21/2013  . S/P placement of cardiac pacemaker 02/28/2016   Medtronic  . S/P primary angioplasty with coronary stent 02/28/2016  . Secondary corneal edema of left eye   . Status post corneal transplant   . Thyroid disease     Past Surgical History:  Procedure Laterality Date  . APPENDECTOMY    . AQUEOUS SHUNT EXTRAOCULAR Left 09/18/2013   Elenore Rota L. Pennie Banter, MD [facility]  . CARDIAC CATHETERIZATION  03/07/2014  . CATARACT EXTRACTION Bilateral   . CATARACT EXTRACTION W/ INTRAOCULAR LENS IMPLANT Left 01/2012   McCuen, MD  . CATARACT EXTRACTION W/ INTRAOCULAR LENS  IMPLANT Right 2003   [facility]  . CORNEAL TRANSPLANT Left 01/17/2015   Keratoplasty, Endothelial. Nelma Rothman, MD [place]   . DESCEMETS STRIPPING AUTOMATED ENDOTHELIAL KERATOPLASTY Left 04/2012   Marilynne Halsted, MD;  . GLAUCOMA SURGERY Left 09/18/2013   Baerveldt BG-101 implant with cornea graft  . LEFT HEART CATH AND CORONARY ANGIOGRAPHY N/A 10/20/2016   Procedure: Left Heart Cath and Coronary Angiography;  Surgeon: Martinique, Peter M, MD;  Location: [facility];  Service: Cardiovascular;  Laterality: N/A;  . PACEMAKER INSERTION  09/2014   Done at [facility].   Norwood Levo REINFORCEMENT Left 09/18/2013   with graft, Edson Snowball, MD, [facility]  . WISDOM TOOTH EXTRACTION      There were no vitals filed for this visit.  Subjective Assessment - 03/24/17 0815    Subjective  The patient was running late today.  He has read his MRI and neck imaging results on my chart.  He notes continued dizziness further describing sensation as crunching in his ears, he denies environmental movement and describes a "wobbly in my head" sensation.   He reports his nausea has improved.  His neck feels tight daily.    Pertinent History  HTN, CAD, pacemaker, thyroid disease  *Reviewed MRI results 03/24/17 in EPIC.    Patient Stated Goals  Patient states his goal is to reduce nausea (currently  feels nausea 3-4 days/week)- depends on activity level increases with dizziness and activity.             Vestibular Assessment - 03/24/17 0820      Vestibular Assessment   General Observation  He notes a 6/10 sensation of "wobbly in my head" that he notes will last until mid day or according to what he is doing.  He notes when he looks up a ladder, it gets worse.   He reports pressure in his upper thoracic/ lower cervical spine.                [facility] Adult PT Treatment/Exercise - 03/24/17 2683      Neuro Re-ed    Neuro Re-ed Details   Standing vertical head motion with min A for balance  (loss of balance in A/P directions with overcorrection); standing wall bumps with eyes open, then eyes closed with min A due to anterior losses of balance.      Exercises   Exercises  Other Exercises    Other Exercises   Supine chin tuck with manual pressure with 3-5 second holds x 8 reps; supine shoulder flexion to 90 then horizontal abduction x 10 reps with c/o soreness in upper back; Supine shoulder flexion and abduction with passive overpressure for stretching; neural glide with wrist extension to increase passive stretch      Manual Therapy   Manual Therapy  Neural Stretch;Manual Traction;Soft tissue mobilization    Manual therapy comments  for soft tissue lengthening and neural gliding    Soft tissue mobilization  bilateral scalenes and upper trap while performing L sidebending performed passive stretch with STM to R upper trap and reversed.     Manual Traction  gentle manual traction with suboccipital release    Neural Stretch  right side, then left side performing abduction of shoulder with wrist extension for nerve gliding.             PT Education - 03/24/17 0904    Education provided  Yes    Education Details  modified HEP to include seated VOR x 1 viewing, and tactile cues during standing head motion    Person(s) Educated  Patient;Spouse    Methods  Explanation;Demonstration;Handout    Comprehension  Verbalized understanding;Returned demonstration       PT Short Term Goals - 03/10/17 2017      PT SHORT TERM GOAL #1   Title  The patient will be indep with HEP for neck stabilization, postural stretching, gaze x 1 adaptation and habituation as indicated.    Time  4    Period  Weeks    Target Date  04/09/17      PT SHORT TERM GOAL #2   Title  The patient will tolerate 30 seconds of gaze x 1 viewing without c/o visual blurring.    Time  4    Period  Weeks    Target Date  04/09/17      PT SHORT TERM GOAL #3   Title  The patient will subjectively report dizziness and  nausea at rest 0/10.    Time  4    Period  Weeks    Target Date  04/09/17      PT SHORT TERM GOAL #4   Title  The patient will be further assessed on balance and LTG to follow, as indicated.    Time  6    Period  Weeks    Target Date  04/09/17  PT Long Term Goals - 03/10/17 2018      PT LONG TERM GOAL #1   Title  The patient will improve functional status survey score from 61% to > or equal to 72% for improved mobility.    Time  6    Period  Weeks    Target Date  05/09/17      PT LONG TERM GOAL #2   Title  The patient will return demo HEP progression for post d/c activities.    Time  6    Period  Weeks    Target Date  05/09/17      PT LONG TERM GOAL #3   Title  The patient will improve Cervical AROM L rotation from 55 degrees to > or equal to 62 degrees.     Time  6    Period  Weeks    Target Date  05/09/17      PT LONG TERM GOAL #4   Title  The patient will report no dizziness with looking up/cervical extension.    Time  6    Period  Weeks    Target Date  05/09/17      PT LONG TERM GOAL #5   Title  The patient will report neck and upper back pain to < or equal to 2/10.    Time  6    Period  Weeks    Target Date  05/09/17            Plan - 03/24/17 0906    Clinical Impression Statement  Recent cervical spine imaging demonstrates C3-C4 cervical spinal stenosis; multilevel foraminal narrowing most noted at C4 and C6.  PT modified current HEP today and continued working on postural stretching, deep flexor strengthening, and gaze.  PT to continue working to STGs/LTGs.     PT Treatment/Interventions  ADLs/Self Care Home Management;Balance training;Neuromuscular re-education;Patient/family education;Functional mobility training;Therapeutic activities;Therapeutic exercise;Manual techniques;Passive range of motion;Vestibular    PT Next Visit Plan  check HEP, UE nerve gliding, scapular strengthening, postural stretching, balance training/multi-sensory      Consulted and Agree with Plan of Care  Patient       Patient will benefit from skilled therapeutic intervention in order to improve the following deficits and impairments:  Abnormal gait, Decreased balance, Dizziness, Pain, Impaired flexibility, Postural dysfunction, Hypomobility  Visit Diagnosis: Dizziness and giddiness  Other abnormalities of gait and mobility  Neck pain  Abnormal posture     Problem List Patient Active Problem List   Diagnosis Date Noted  . Sinus bradycardia-tachycardia syndrome ([place]) 03/10/2017  . Dizziness 11/18/2016  . Left arm numbness 11/18/2016  . Nonintractable headache 11/18/2016  . Word finding difficulty 11/18/2016  . Chronic chest pain 10/20/2016  . Cardiac pacemaker in situ 04/03/2016  . Palpitations 04/03/2016  . High cholesterol 03/05/2016  . OSA (obstructive sleep apnea) 03/05/2016  . Sleep apnea 03/05/2016  . CAD S/P percutaneous coronary angioplasty 02/28/2016  . Essential hypertension 02/28/2016  . Atrial fibrillation, new onset ([place]) 02/28/2016  . S/P primary angioplasty with coronary stent 02/28/2016  . S/P placement of cardiac pacemaker 02/28/2016  . 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 Pre-diabetes 06/10/2014  . SOB (shortness of breath) on exertion 05/28/2014  . Adult BMI 30+ 05/24/2014  . Hyperlipidemia, mixed 05/24/2014  . 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 OSA on CPAP 05/24/2014  . Polycythemia 05/24/2014  . Status post bilateral cataract extraction 11/21/2013  . Secondary corneal edema of left eye 10/18/2013  . Status post corneal transplant  10/18/2013  . Asthma 09/15/2013  . GERD (gastroesophageal reflux disease) 09/15/2013  . Hypothyroidism 09/15/2013  . 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 History of corneal transplant 09/05/2013  . 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 Influenza A 03/06/2013  . 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 Corneal edema 12/23/2012  . PCO (posterior capsular opacification) 12/23/2012  . Pseudophakia  12/23/2012    Grayton Lobo, PT 03/24/2017, 9:09 AM  [facility] [address] [place], [place], [address] Phone: [phone]   Fax:  [phone]  Name: Thomas Walsh MRN: [id number] Date of Birth: January 18, 1956

## 2017-03-24 NOTE — Patient Instructions (Addendum)
  Gaze Stabilization: Sitting    Keeping eyes on target on wall 3 feet away, and move head side to side for _30-60___ seconds. Repeat while moving head up and down for __30-60__ seconds. Do __2-3__ sessions per day.  Copyright  VHI. All rights reserved.   Gaze Stabilization: Tip Card  1.Target must remain in focus, not blurry, and appear stationary while head is in motion. 2.Perform exercises with small head movements (45 to either side of midline). 3.Increase speed of head motion so long as target is in focus. 4.If you wear eyeglasses, be sure you can see target through lens (therapist will give specific instructions for bifocal / progressive lenses). 5.These exercises may provoke dizziness or nausea. Work through these symptoms. If too dizzy, slow head movement slightly. Rest between each exercise. 6.Exercises demand concentration; avoid distractions.  Copyright  VHI. All rights reserved.      Thoracic Self-Mobilization (Supine)    With rolled towel placed lengthwise at lower ribs level, lie back on towel with arms outstretched. Hold _2 minutes. Relax. Repeat __1-2__ times per day as needed for postural stretching.  http://orth.exer.us/1001  Copyright  VHI. All rights reserved.         Electronically signed by Mervyn Gay, PT at 03/10/2017 12:36 PM   CHEST: Doorway, Bilateral - Standing    Standing in doorway, place hands on wall with elbows bent at shoulder height. Lean forward. Hold __20_ seconds. __3_ reps per set.  Extensors, Supine    Lie supine, head on small, rolled towel or one pillow.  Gently tuck chin and bring toward chest. Hold __5_ seconds. Repeat __10_ times per session. Do _2__ sessions per day.  Copyright  VHI. All rights reserved.    Copyright  VHI. All rights reserved.   Neurovascular: Radial Nerve Glide With Cervical Bias    Stand with left arm out to side, holding doorjamb, thumb down. Slowly move opposite side  ear toward shoulder as far as possible without pain. Repeat _3__ times for 20 seconds.  Copyright  VHI. All rights reserved.     BALANCE:  Feet Apart, Head Motion - Eyes Open    With eyes open, feet apart, move head slowly: up and down 5 times.  *SMALL MOVEMENT TO BEGIN  AND LEAN WITH THE BACK OF YOUR KNEES/THIGHS AGAINST A HEAVY CHAIR OR BED FOR FEEDBACK.  Copyright  VHI. All rights reserved.   Feet Apart (Compliant Surface) Varied Arm Positions - Eyes Open    With eyes open, standing on compliant surface: __pillow_, feet shoulder width apart and arms AT YOUR SIDE, look at a stationary object. Hold __30__ seconds. Repeat _3___ times per session. Do _1-2___ sessions per day.  Copyright  VHI. All rights reserved.

## 2017-03-25 ENCOUNTER — Telehealth: Payer: Self-pay

## 2017-03-25 NOTE — Telephone Encounter (Signed)
LMOM relaying message below.  Advised to contact our office to schedule Neurocognitive testing if he wishes to proceed with that.

## 2017-03-25 NOTE — Telephone Encounter (Signed)
-----   Message from Cameron Sprang, MD sent at 03/22/2017 10:18 AM EST ----- Pls let him know the MRI brain is normal, no evidence of tumor, stroke, or bleed. The MRI neck did show arthritis changes that are causing pinched nerves. This is likely the cause of his headaches, pls have him tell PT to also work on his neck for the headaches. If he would like to proceed with Neurocognitive testing for further memory testing, pls schedule for him. Thanks

## 2017-04-05 ENCOUNTER — Encounter: Payer: Self-pay | Admitting: Rehabilitative and Restorative Service Providers"

## 2017-04-05 ENCOUNTER — Ambulatory Visit: Payer: BLUE CROSS/BLUE SHIELD | Attending: Neurology | Admitting: Rehabilitative and Restorative Service Providers"

## 2017-04-05 DIAGNOSIS — M542 Cervicalgia: Secondary | ICD-10-CM

## 2017-04-05 DIAGNOSIS — R2689 Other abnormalities of gait and mobility: Secondary | ICD-10-CM | POA: Diagnosis present

## 2017-04-05 DIAGNOSIS — R293 Abnormal posture: Secondary | ICD-10-CM | POA: Diagnosis present

## 2017-04-05 DIAGNOSIS — R42 Dizziness and giddiness: Secondary | ICD-10-CM | POA: Diagnosis present

## 2017-04-05 NOTE — Therapy (Signed)
[facility] [address] [place], [place], [address] Phone: [phone]   Fax:  [phone]  Physical Therapy Treatment  Patient Details  Name: Thomas Walsh MRN: [id number] Date of Birth: 27-Jan-1956 Referring Provider: Ellouise Newer, MD   Encounter Date: 04/05/2017  PT End of Session - 04/05/17 1523    Visit Number  4    Number of Visits  12    Date for PT Re-Evaluation  05/09/17    Authorization Type  [employer] 30 visit limit    Authorization - Visit Number  4    Authorization - Number of Visits  30    PT Start Time  1320    PT Stop Time  1410    PT Time Calculation (min)  50 min    Activity Tolerance  Patient tolerated treatment well    Behavior During Therapy  [facility] for tasks assessed/performed       Past Medical History:  Diagnosis Date  . Asthma   . 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 Coronary artery disease involving native coronary artery of native heart with unstable angina pectoris ([place]) 02/28/2016  . Essential hypertension 02/28/2016  . GERD (gastroesophageal reflux disease)   . HLD (hyperlipidemia)   . 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 OSA treated with BiPAP   . PAF (paroxysmal atrial fibrillation) ([place]) 02/28/2016  . PCO (posterior capsular opacification)   . Pseudophakia of both eyes   . S/P bilateral cataract extraction 11/21/2013  . S/P placement of cardiac pacemaker 02/28/2016   [employer]tronic  . S/P primary angioplasty with coronary stent 02/28/2016  . Secondary corneal edema of left eye   . Status post corneal transplant   . Thyroid disease     Past Surgical History:  Procedure Laterality Date  . APPENDECTOMY    . AQUEOUS SHUNT EXTRAOCULAR Left 09/18/2013   Elenore Rota L. Pennie Banter, MD [facility]  . CARDIAC CATHETERIZATION  03/07/2014  . CATARACT EXTRACTION Bilateral   . CATARACT EXTRACTION W/ INTRAOCULAR LENS IMPLANT Left 01/2012   McCuen, MD  . CATARACT EXTRACTION W/ INTRAOCULAR LENS  IMPLANT Right 2003   [facility]  . CORNEAL TRANSPLANT Left 01/17/2015   Keratoplasty, Endothelial. Nelma Rothman, MD [facility]   . DESCEMETS STRIPPING AUTOMATED ENDOTHELIAL KERATOPLASTY Left 04/2012   Marilynne Halsted, MD;  . GLAUCOMA SURGERY Left 09/18/2013   [place] BG-101 implant with cornea graft  . LEFT HEART CATH AND CORONARY ANGIOGRAPHY N/A 10/20/2016   Procedure: Left Heart Cath and Coronary Angiography;  Surgeon: Martinique, Peter M, MD;  Location: [facility];  Service: Cardiovascular;  Laterality: N/A;  . PACEMAKER INSERTION  09/2014   Done at [facility].   [place] Levo REINFORCEMENT Left 09/18/2013   with graft, Edson Snowball, MD, [facility]  . WISDOM TOOTH EXTRACTION      There were no vitals filed for this visit.  Subjective Assessment - 04/05/17 1324    Subjective  The patient notes that he was helping at a store on Saturday and tried to fix an automatic door.  With looking up, he experienced nausea, disorientation, and dizzy.  "When you get like that you can't do anything, you just have to hold on.  Your focus goes away."  He notes he doesn't feel like talking or doing anything.  He felt bad for 30 minutes.   His neck hurts all of the time, with  no increase in pain with this episode.    This was the  only epiode noted this week.   Depth perception issues since cornea transplant.     Pertinent History  HTN, CAD, pacemaker, thyroid disease  *Reviewed MRI results 03/24/17 in EPIC.    Patient Stated Goals  Patient states his goal is to reduce nausea (currently feels nausea 3-4 days/week)- depends on activity level increases with dizziness and activity.    Currently in Pain?  Yes    Pain Score  6     Pain Location  Neck    Pain Orientation  -- between shoulder blades    Pain Descriptors / Indicators  Aching;Headache    Pain Onset  More than a month ago    Pain Frequency  Constant    Aggravating Factors   patient feels exercises are helping range of motion in neck but  not impacting pain.    Pain Relieving Factors  unsure;         [facility] PT Assessment - 04/05/17 1524      ROM / Strength   AROM / PROM / Strength  AROM      AROM   AROM Assessment Site  Cervical    Cervical Flexion  40    Cervical Extension  15    Cervical - Right Side Bend  20    Cervical - Left Side Bend  25    Cervical - Right Rotation  62    Cervical - Left Rotation  49                  [facility] Adult PT Treatment/Exercise - 04/05/17 1336      Self-Care   Self-Care  Other Self-Care Comments    Other Self-Care Comments   Recommended patient maintain a journal to differentiate head position and symptoms.      Neuro Re-ed    Neuro Re-ed Details   Walking with head motion horizontal and vertical with unsteadiness noted requiring CGA .        Exercises   Exercises  Other Exercises    Other Exercises   Standing door frame stretch, seated neck AROM, PROM supine rotation and sidebending. Assisted supine stretching into rotation and flexion right and left sides.      Manual Therapy   Manual Therapy  Neural Stretch;Manual Traction;Soft tissue mobilization    Soft tissue mobilization  Bilateral scapular mobilization with emphasis on rhomboids, levator scapula and upper trap    Manual Traction  manual traction with suboccipital release.             PT Education - 04/05/17 1522    Education provided  Yes    Education Details  REcommended continue current HEP and f/u in 2 weeks     Person(s) Educated  Patient;Spouse    Methods  Explanation    Comprehension  Verbalized understanding       PT Short Term Goals - 04/05/17 1347      PT SHORT TERM GOAL #1   Title  The patient will be indep with HEP for neck stabilization, postural stretching, gaze x 1 adaptation and habituation as indicated.    Time  4    Period  Weeks    Status  Achieved      PT SHORT TERM GOAL #2   Title  The patient will tolerate 30 seconds of gaze x 1 viewing without c/o visual blurring.     Baseline  Pt performs at slow pace.     Time  4    Period  Weeks  Status  Achieved      PT SHORT TERM GOAL #3   Title  The patient will subjectively report dizziness and nausea at rest 0/10.    Time  4    Period  Weeks    Status  Achieved      PT SHORT TERM GOAL #4   Title  The patient will be further assessed on balance and LTG to follow, as indicated.    Time  6    Period  Weeks    Target Date  04/09/17        PT Long Term Goals - 04/05/17 1347      PT LONG TERM GOAL #1   Title  The patient will improve functional status survey score from 61% to > or equal to 72% for improved mobility.    Time  6    Period  Weeks      PT LONG TERM GOAL #2   Title  The patient will return demo HEP progression for post d/c activities.    Time  6    Period  Weeks      PT LONG TERM GOAL #3   Title  The patient will improve Cervical AROM L rotation from 55 degrees to > or equal to 62 degrees.     Baseline  L rotation 59 deg, R rot 62 deg, R SB 20 deg, L SB 25, flexion  40 deg, ex 15 deg    Time  6    Period  Weeks      PT LONG TERM GOAL #4   Title  The patient will report no dizziness with looking up/cervical extension.    Time  6    Period  Weeks      PT LONG TERM GOAL #5   Title  The patient will report neck and upper back pain to < or equal to 2/10.    Time  6    Period  Weeks            Plan - 04/05/17 1528    Clinical Impression Statement  The patient met 3/4 STGs.  PT to send note to MD regarding continued nausea with most recent episode correlated to looking up.  Plan to f/u with patient in 2 weeks as he is working daily on HEP for postural stretching and neck stabilization activities.     PT Treatment/Interventions  ADLs/Self Care Home Management;Balance training;Neuromuscular re-education;Patient/family education;Functional mobility training;Therapeutic activities;Therapeutic exercise;Manual techniques;Passive range of motion;Vestibular    PT Next Visit Plan  review  HEP, scapular strengthening, neck stabilization, check LTGs, balance and dynamic gait.    Consulted and Agree with Plan of Care  Patient       Patient will benefit from skilled therapeutic intervention in order to improve the following deficits and impairments:  Abnormal gait, Decreased balance, Dizziness, Pain, Impaired flexibility, Postural dysfunction, Hypomobility  Visit Diagnosis: Dizziness and giddiness  Other abnormalities of gait and mobility  Neck pain  Abnormal posture     Problem List Patient Active Problem List   Diagnosis Date Noted  . Sinus bradycardia-tachycardia syndrome ([place]) 03/10/2017  . Dizziness 11/18/2016  . Left arm numbness 11/18/2016  . Nonintractable headache 11/18/2016  . Word finding difficulty 11/18/2016  . Chronic chest pain 10/20/2016  . Cardiac pacemaker in situ 04/03/2016  . Palpitations 04/03/2016  . High cholesterol 03/05/2016  . OSA (obstructive sleep apnea) 03/05/2016  . Sleep apnea 03/05/2016  . CAD S/P percutaneous coronary angioplasty 02/28/2016  .  Essential hypertension 02/28/2016  . Atrial fibrillation, new onset ([place]) 02/28/2016  . S/P primary angioplasty with coronary stent 02/28/2016  . S/P placement of cardiac pacemaker 02/28/2016  . 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 Pre-diabetes 06/10/2014  . SOB (shortness of breath) on exertion 05/28/2014  . Adult BMI 30+ 05/24/2014  . Hyperlipidemia, mixed 05/24/2014  . 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 OSA on CPAP 05/24/2014  . Polycythemia 05/24/2014  . Status post bilateral cataract extraction 11/21/2013  . Secondary corneal edema of left eye 10/18/2013  . Status post corneal transplant 10/18/2013  . Asthma 09/15/2013  . GERD (gastroesophageal reflux disease) 09/15/2013  . Hypothyroidism 09/15/2013  . 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 History of corneal transplant 09/05/2013  . 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 Influenza A 03/06/2013  . 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 Corneal  edema 12/23/2012  . PCO (posterior capsular opacification) 12/23/2012  . Pseudophakia 12/23/2012    Orit Sanville, PT 04/05/2017, 3:31 PM  [facility] [address] [place], [place], [address] Phone: [phone]   Fax:  [phone]  Name: Heriberto Stmartin MRN: [id number] Date of Birth: [birth year]

## 2017-04-06 ENCOUNTER — Other Ambulatory Visit: Payer: Self-pay

## 2017-04-06 DIAGNOSIS — R42 Dizziness and giddiness: Secondary | ICD-10-CM

## 2017-04-14 ENCOUNTER — Telehealth: Payer: Self-pay | Admitting: Neurology

## 2017-04-14 NOTE — Telephone Encounter (Signed)
[facility] called and said that they are unable to schedule an MRA for this patient due to him having a [employer] . She said he will need to have it at the hospital. Thanks

## 2017-04-14 NOTE — Telephone Encounter (Signed)
Pt's wife called and said the MRA has to be done at [facility] because of his pace maker

## 2017-04-15 NOTE — Telephone Encounter (Signed)
Spoke with pt's wife.  Made aware of appointment below.

## 2017-04-15 NOTE — Telephone Encounter (Signed)
Called centralized scheduling - pt scheduled for Thursday Jan 24th @ 2PM

## 2017-04-15 NOTE — Telephone Encounter (Signed)
Patient's wife called regarding not having heard from anyone from [facility] about setting up an MRA. Please call. Thanks

## 2017-04-22 ENCOUNTER — Ambulatory Visit (HOSPITAL_COMMUNITY): Payer: BLUE CROSS/BLUE SHIELD

## 2017-04-23 ENCOUNTER — Encounter: Payer: Self-pay | Admitting: Rehabilitative and Restorative Service Providers"

## 2017-04-23 ENCOUNTER — Ambulatory Visit: Payer: BLUE CROSS/BLUE SHIELD | Admitting: Rehabilitative and Restorative Service Providers"

## 2017-04-23 VITALS — BP 141/76

## 2017-04-23 DIAGNOSIS — R42 Dizziness and giddiness: Secondary | ICD-10-CM | POA: Diagnosis not present

## 2017-04-23 DIAGNOSIS — R2689 Other abnormalities of gait and mobility: Secondary | ICD-10-CM

## 2017-04-23 DIAGNOSIS — M542 Cervicalgia: Secondary | ICD-10-CM

## 2017-04-23 DIAGNOSIS — R293 Abnormal posture: Secondary | ICD-10-CM

## 2017-04-23 NOTE — Patient Instructions (Signed)
Gaze Stabilization: Sitting    Keeping eyes on target on wall 3 feet away, and move head side to side for _30-60___ seconds. Repeat while moving head up and down for __30-60__ seconds. Do __2-3__ sessions per day.  Copyright  VHI. All rights reserved.   Gaze Stabilization: Tip Card  1.Target must remain in focus, not blurry, and appear stationary while head is in motion. 2.Perform exercises with small head movements (45 to either side of midline). 3.Increase speed of head motion so long as target is in focus. 4.If you wear eyeglasses, be sure you can see target through lens (therapist will give specific instructions for bifocal / progressive lenses). 5.These exercises may provoke dizziness or nausea. Work through these symptoms. If too dizzy, slow head movement slightly. Rest between each exercise. 6.Exercises demand concentration; avoid distractions.  Copyright  VHI. All rights reserved.     BALANCE:  Feet Apart, Head Motion - Eyes Open    With eyes open, feet apart, move head slowly: side to side 5 times. *SMALL MOVEMENT TO BEGIN  AND LEAN WITH THE BACK OF YOUR KNEES/THIGHS AGAINST A HEAVY CHAIR OR BED FOR FEEDBACK.  Copyright  VHI. All rights reserved.  Feet Apart, Varied Arm Positions - Eyes Closed    Stand with feet shoulder width apart and arms by your side.  Close eyes and visualize upright position. Hold __5__ seconds at a time.  Work up to 10 seconds as you are able.  HAVE HELP TO STAND IN FRONT OF YOU FOR SAFETY. Repeat __3-4__ times per session. Do __1-2__ sessions per day.  Copyright  VHI. All rights reserved.    Feet Apart (Compliant Surface) Varied Arm Positions - Eyes Open    With eyes open, standing on compliant surface: __pillow_, feet shoulder width apart and arms AT YOUR SIDE, look at a stationary object. Hold __30__ seconds. Repeat _3___ times per session. Do _1-2___ sessions per day.  Copyright  VHI. All rights reserved.

## 2017-04-23 NOTE — Therapy (Addendum)
[facility] [address] [place], [place], [address] Phone: [phone]   Fax:  [phone]  Physical Therapy Treatment  Patient Details  Name: Thomas Walsh MRN: [id number] Date of Birth: Jun 10, 1955 Referring Provider: Ellouise Newer, MD   Encounter Date: 04/23/2017  PT End of Session - 04/23/17 1537    Visit Number  5    Number of Visits  12    Date for PT Re-Evaluation  05/09/17    Authorization Type  BCBS 30 visit limit    Authorization - Visit Number  5    Authorization - Number of Visits  30    PT Start Time  9030    PT Stop Time  1537    PT Time Calculation (min)  45 min    Activity Tolerance  Patient tolerated treatment well    Behavior During Therapy  [facility] for tasks assessed/performed       Past Medical History:  Diagnosis Date  . Asthma   . 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 Coronary artery disease involving native coronary artery of native heart with unstable angina pectoris ([place]) 02/28/2016  . Essential hypertension 02/28/2016  . GERD (gastroesophageal reflux disease)   . HLD (hyperlipidemia)   . 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 OSA treated with BiPAP   . PAF (paroxysmal atrial fibrillation) ([place]) 02/28/2016  . PCO (posterior capsular opacification)   . Pseudophakia of both eyes   . S/P bilateral cataract extraction 11/21/2013  . S/P placement of cardiac pacemaker 02/28/2016   Medtronic  . S/P primary angioplasty with coronary stent 02/28/2016  . Secondary corneal edema of left eye   . Status post corneal transplant   . Thyroid disease     Past Surgical History:  Procedure Laterality Date  . APPENDECTOMY    . AQUEOUS SHUNT EXTRAOCULAR Left 09/18/2013   Elenore Rota L. Pennie Banter, MD [facility]  . CARDIAC CATHETERIZATION  03/07/2014  . CATARACT EXTRACTION Bilateral   . CATARACT EXTRACTION W/ INTRAOCULAR LENS IMPLANT Left 01/2012   McCuen, MD  . CATARACT EXTRACTION W/ INTRAOCULAR LENS  IMPLANT Right 2003   [facility]  . CORNEAL TRANSPLANT Left 01/17/2015   Keratoplasty, Endothelial. Nelma Rothman, MD [place]   . DESCEMETS STRIPPING AUTOMATED ENDOTHELIAL KERATOPLASTY Left 04/2012   Marilynne Halsted, MD;  . GLAUCOMA SURGERY Left 09/18/2013   Baerveldt BG-101 implant with cornea graft  . LEFT HEART CATH AND CORONARY ANGIOGRAPHY N/A 10/20/2016   Procedure: Left Heart Cath and Coronary Angiography;  Surgeon: Martinique, Peter M, MD;  Location: [facility];  Service: Cardiovascular;  Laterality: N/A;  . PACEMAKER INSERTION  09/2014   Done at [facility].   . SCLERAL REINFORCEMENT Left 09/18/2013   with graft, Edson Snowball, MD, [place] EXTRACTION      Vitals:   04/23/17 1455  BP: (!) 141/76    Subjective Assessment - 04/23/17 1455    Subjective  The patient reports he had a bad episode of dizziness the other day.  He took notes 04/09/17:  Off balance, blurry vision, back of head had pressure, neck felt "stress down into my shoulders".  Had a sensation of fluid in his ear "like you could hear water".  Constant sensation, no nausea, had to hold onto walls when walking t/o the day.  04/11/17:  Felt pressure in posterior aspect of head.  Couldn't look up to the screen, had nausea and it makes him feel bad "  not always nausea".  Denies neck strain, but deep feeling of head pressure.  He noted double vision that lasted through church.  Stability improved after lunch, but head felt bad the rest of the day.  On Monday 04/19/17 was off balance when working and had to hold walls. He notes head pressure on Monday but states "I keep a certain amount all the time, it doesn't go away."    Pertinent History  HTN, CAD, pacemaker, thyroid disease  *Reviewed MRI results 03/24/17 in EPIC.    Patient Stated Goals  Patient states his goal is to reduce nausea (currently feels nausea 3-4 days/week)- depends on activity level increases with dizziness and activity.    Currently  in Pain?  Yes    Pain Score  -- stiffness rated 4-5/10, not pain    Pain Location  Neck    Pain Orientation  Lower    Pain Descriptors / Indicators  Aching    Pain Radiating Towards  radiates into shoulders and describes upper thoracic region.    Pain Onset  More than a month ago    Pain Frequency  Constant    Aggravating Factors   He thinks exercise helps ROM and posture, "but it's not helping the problems"    Pain Relieving Factors  unsure                      [facility] Adult PT Treatment/Exercise - 04/23/17 1524      Self-Care   Self-Care  Other Self-Care Comments    Other Self-Care Comments   Patient c/o chest pain after sensory organization testing noting amount of exertion brought it on.  He notes overall chest pain improved after cardiologist prescribed med and he carries nitroglycerin as well.  The patient and PT discussed working on balance HEP.  He has been doing neck portion of HEP, but not balance activities.  We discussed that this is his chief complaint:  instability.  He has to do home program to see if it will improve his balance.      Neuro Re-ed    Neuro Re-ed Details   Sensory organization testing scoring 23% compared to age/height norms of 65%.   Paitent uses some somatosensory feedback for balance and demo's no use of visual or vestibular inputs for balance.  PT reviewed prior HEP for balance including:  gaze x 1 adaptation, standing head motion, standing with eyes closed and pillow stand wiht eyes open.              PT Education - 04/23/17 1533    Education provided  Yes    Education Details  HEP:  reviewed balance exercises and added further to home.    Person(s) Educated  Patient;Spouse    Methods  Explanation;Demonstration;Handout    Comprehension  Verbalized understanding;Returned demonstration       PT Short Term Goals - 04/23/17 1501      PT SHORT TERM GOAL #1   Title  The patient will be indep with HEP for neck stabilization, postural  stretching, gaze x 1 adaptation and habituation as indicated.    Time  4    Period  Weeks    Status  Achieved      PT SHORT TERM GOAL #2   Title  The patient will tolerate 30 seconds of gaze x 1 viewing without c/o visual blurring.    Baseline  Pt performs at slow pace.     Time  4  Period  Weeks    Status  Achieved      PT SHORT TERM GOAL #3   Title  The patient will subjectively report dizziness and nausea at rest 0/10.    Time  4    Period  Weeks    Status  Achieved      PT SHORT TERM GOAL #4   Title  The patient will be further assessed on balance and LTG to follow, as indicated.    Baseline  SOT=23%    Time  6    Period  Weeks    Status  Achieved        PT Long Term Goals - 04/23/17 1619      PT LONG TERM GOAL #1   Title  The patient will improve functional status survey score from 61% to > or equal to 72% for improved mobility.    Time  6    Period  Weeks    Target Date  05/09/17      PT LONG TERM GOAL #2   Title  The patient will return demo HEP progression for post d/c activities.    Time  6    Period  Weeks      PT LONG TERM GOAL #3   Title  The patient will improve Cervical AROM L rotation from 55 degrees to > or equal to 62 degrees.     Baseline  L rotation 59 deg, R rot 62 deg, R SB 20 deg, L SB 25, flexion  40 deg, ex 15 deg    Time  6    Period  Weeks      PT LONG TERM GOAL #4   Title  The patient will report no dizziness with looking up/cervical extension.    Time  6    Period  Weeks      PT LONG TERM GOAL #5   Title  The patient will report neck and upper back pain to < or equal to 2/10.    Time  6    Period  Weeks      Additional Long Term Goals   Additional Long Term Goals  Yes      PT LONG TERM GOAL #6   Title  The patient will improve SOT from 23% to > or equal to 34% to demo improving balance.      Baseline  23%- norm for age/height is 65%    Time  4    Period  Weeks    Status  New    Target Date  05/09/17             Plan - 04/23/17 1622    Clinical Impression Statement  The patient shows significant decrease in use of sensory inputs for balance scoring 23% compared to age/heigh norm of 65% on sensory organization testing.  PT emphasized need for balance HEP.  Patient to call to f/u with PT once he schedules MRA (had to wait due to high copay with insurance).      PT Treatment/Interventions  ADLs/Self Care Home Management;Balance training;Neuromuscular re-education;Patient/family education;Functional mobility training;Therapeutic activities;Therapeutic exercise;Manual techniques;Passive range of motion;Vestibular    PT Next Visit Plan  Review balance HEP, check LTGs, review neck activities as indicated    Consulted and Agree with Plan of Care  Patient       Patient will benefit from skilled therapeutic intervention in order to improve the following deficits and impairments:  Abnormal gait, Decreased balance, Dizziness, Pain, Impaired  flexibility, Postural dysfunction, Hypomobility  Visit Diagnosis: Dizziness and giddiness  Other abnormalities of gait and mobility  Neck pain  Abnormal posture    PHYSICAL THERAPY DISCHARGE SUMMARY  Visits from Start of Care: 5  Current functional level related to goals / functional outcomes: See above   Remaining deficits: Continues with balance- was placed on hold to undergo further medical workup and did not return to therapy.   Education / Equipment: Home program.  Plan: Patient agrees to discharge.  Patient goals were partially met. Patient is being discharged due to a change in medical status.  ?????     Thank you for the referral of this patient. Rudell Cobb, MPT   [place], PT 04/23/2017, 4:24 PM  [facility] [address] [place], [place], [address] Phone: [phone]   Fax:  [phone]  Name: Thomas Walsh MRN: [id number] Date of Birth:  [birth year]

## 2017-04-27 ENCOUNTER — Other Ambulatory Visit: Payer: Self-pay | Admitting: Internal Medicine

## 2017-04-30 ENCOUNTER — Other Ambulatory Visit: Payer: Self-pay | Admitting: Internal Medicine

## 2017-04-30 NOTE — Telephone Encounter (Signed)
°*  STAT* If patient is at the pharmacy, call can be transferred to refill team.   1. Which medications need to be refilled? (please list name of each medication and dose if known) Ranexa 500 mg  2. Which pharmacy/location (including street and city if local pharmacy) is medication to be sent to? [place], [place] [address]  3. Do they need a 30 day or 90 day supply?

## 2017-04-30 NOTE — Telephone Encounter (Signed)
REFILL 

## 2017-05-21 ENCOUNTER — Other Ambulatory Visit: Payer: Self-pay

## 2017-06-09 ENCOUNTER — Telehealth: Payer: Self-pay | Admitting: Cardiology

## 2017-06-09 NOTE — Telephone Encounter (Signed)
Returned call to patient of Dr. Debara Pickett. He reports chest pressure that is constant and chest pain that is sporadic, symptoms occur @ rest and with exertion. He is aware that his symptoms have been worsening over the last few weeks. He has noticed that his ability to walk distances has lessened. He has used NTG prn. He feels his pacer is doing OK, has not been able to check remotely. He states his BP is OK, occasionally in the 801K systolic when he feels bad. He reports his blood sugar is OK. Patient states he does not feel that he is at the point he needs to go to the hospital for evaluation. Stressed the importance of follow up with cardiology and offered 3/15 @ 945 with Dr. Debara Pickett but patient cannot make this appointment. He states he is OK with seeing an APP - has seen [place], [place] in the past. Informed him that I will send a message to schedulers to call him to arrange an appt within the next week or so.

## 2017-06-09 NOTE — Telephone Encounter (Signed)
Doubt this is a pacemaker issue - if he is having unstable angina, I agree with ER recommendation. If he does not desire to go there, we should try and arrange an appointment with APP this week if possible.  Dr. Lemmie Evens

## 2017-06-09 NOTE — Telephone Encounter (Signed)
Patient called and stated that he feels short of breath / chest pressure. He is not able to send a remote transmission b/c he never received a monitor to do so. I was able to order the monitor a mycarelink smart (2500). Informed the pt that the monitor was ordered. New remote appt scheduled for 3 weeks out for July 05, 2017 this should allow enough time for patient to receive the new monitor. Call routed to [place]

## 2017-06-09 NOTE — Telephone Encounter (Signed)
Spoke with pt, informed him that with symptoms of chest pressure and shortness of breath the policy was that he should go to the ER, pt did not sound audibly distressed on phone, pt stated that he has recurrent chest pain and knows when he needs to go the the ER. Informed pt that the ER could check his pacemaker out. Pt stated that he has taken a couple of nitros and has helped, pt stated that he has an extensive cardiac history and that some of that causes chest pain. Pt requested that I get the monitor to him as soon as possible informed pt that I had no control over that and the monitor was shipped out by the company. Informed pt that I would send this to triage.

## 2017-06-11 ENCOUNTER — Ambulatory Visit: Payer: BLUE CROSS/BLUE SHIELD | Admitting: Internal Medicine

## 2017-06-14 NOTE — Progress Notes (Signed)
Cardiology Office Note   Date:  06/15/2017   ID:  Thomas Walsh, DOB 22-Jun-1955, MRN [id number]  PCP:  Druscilla Brownie, PA-C  Cardiologist: Dr. Debara Pickett  Chief Complaint  Patient presents with  . Chest Pain  . Coronary Artery Disease  . Hypertension   History of Present Illness: Thomas Walsh is a 62 y.o. male who presents for ongoing assessment and management of a very complex cardiac history, has been followed in the past by Dr. Marcello Moores, [facility] , [place],  [place].   Stress test completed by Dr. Marcello Moores revealed there were changes suggestive of ischemia and the test was discontinued. Interpretation of that nuclear stress test was "myocardial perfusion imaging is positive for inducible ischemia. There are regions of reversible ischemia located in the inferior wall of the heart. Overall left ventricular systolic function is normal without regional wall motion abnormalities noted above." He was then going to be scheduled for elective heart catheterization however he presented to the emergency department due to continued chest pain. He underwent left heart catheterization on 03/07/2014.   Left heart catheterization on 03/07/2014. 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 After the procedure however he continued to have chest pain symptoms. He was placed on aspirin and Effient and started on low-dose calcium channel blocker which caused low blood pressure and was discontinued.  In January 2016 he was also readmitted for treatment of SVT through [facility] , in [place]. He was also seen in the emergency room and failed no [place] and year and was treated with adenosine, converting to normal sinus rhythm. He was subsequently referred to  [facility] in [facility] for ablation, which was not Lasix vessel and a pacemaker was required.   Received a Medtronic pacemaker (MRI safe), was found to be pacemaker dependent on follow-up. He also has a history of AVNRT. He last saw EP, Dr. Lovena Le was not found to be pacemaker dependent. He was scheduled for cardiopulmonary exercise stress test which revealed no significant circulatory limitation to exercise.  The patient had a repeat cardiac catheterization on 10/20/2016 in the setting of recurrent anginal symptoms despite increasing isosorbide. This revealed 20% mid LAD stenosis, normal LV function, normal LVEDP and patent stents. Started on low-dose amlodipine 2.5 mg. He was last seen by Dr. Debara Pickett on 02/16/2017, he did not complain any new angina symptoms, his other chest pain was found to be neurologic in etiology, he was due to be seen in March. Other history includes hypertension, dyslipidemia, OSA on BiPAP. No medication changes or new testing was ordered by Dr. Debara Pickett on that last office visit.   Comes back today with complaints recurrent chest pain and pressure. Radiating to his neck and shoulder. The patient is also stating that his blood pressure is been elevated over the last 2 weeks. He's had to take clonidine each day he takes when necessary for elevated blood pressure. He has been medically compliant but has become more anxious about recurrent discomfort.  Past Medical History:  Diagnosis Date  . Asthma   . 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 Coronary artery disease involving native coronary artery of native heart with unstable angina pectoris ([place]) 02/28/2016  . Essential hypertension 02/28/2016  . GERD (gastroesophageal reflux disease)   . HLD (hyperlipidemia)   . Iris  nevus 09/05/2013  . 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 OSA treated with BiPAP   . PAF (paroxysmal atrial fibrillation) ([place]) 02/28/2016  . PCO (posterior capsular opacification)   . Pseudophakia of both eyes   . S/P bilateral  cataract extraction 11/21/2013  . S/P placement of cardiac pacemaker 02/28/2016   Medtronic  . S/P primary angioplasty with coronary stent 02/28/2016  . Secondary corneal edema of left eye   . Status post corneal transplant   . Thyroid disease     Past Surgical History:  Procedure Laterality Date  . APPENDECTOMY    . AQUEOUS SHUNT EXTRAOCULAR Left 09/18/2013   Elenore Rota L. Pennie Banter, MD [facility]  . CARDIAC CATHETERIZATION  03/07/2014  . CATARACT EXTRACTION Bilateral   . CATARACT EXTRACTION W/ INTRAOCULAR LENS IMPLANT Left 01/2012   McCuen, MD  . CATARACT EXTRACTION W/ INTRAOCULAR LENS IMPLANT Right 2003   [facility]  . CORNEAL TRANSPLANT Left 01/17/2015   Keratoplasty, Endothelial. Nelma Rothman, MD [place]   . DESCEMETS STRIPPING AUTOMATED ENDOTHELIAL KERATOPLASTY Left 04/2012   Marilynne Halsted, MD;  . GLAUCOMA SURGERY Left 09/18/2013   Baerveldt BG-101 implant with cornea graft  . LEFT HEART CATH AND CORONARY ANGIOGRAPHY N/A 10/20/2016   Procedure: Left Heart Cath and Coronary Angiography;  Surgeon: Martinique, Peter M, MD;  Location: [facility];  Service: Cardiovascular;  Laterality: N/A;  . PACEMAKER INSERTION  09/2014   Done at [facility].   . SCLERAL REINFORCEMENT Left 09/18/2013   with graft, Edson Snowball, MD, [facility]  . WISDOM TOOTH EXTRACTION       Current Outpatient Medications  Medication Sig Dispense Refill  . amLODipine (NORVASC) 2.5 MG tablet Take 2.5 mg every morning 90 tablet 3  . aspirin 325 MG tablet Take 325 mg by mouth daily.    Marland Kitchen atorvastatin (LIPITOR) 40 MG tablet Take 1 tablet (40 mg total) by mouth daily. 90 tablet 1  . Brinzolamide-Brimonidine 1-0.2 % SUSP Apply 1 drop to eye 2 (two) times daily.     . cloNIDine (CATAPRES) 0.1 MG tablet Pt takes only if blood pressure goes over 150  2  . ezetimibe (ZETIA) 10 MG tablet Take 10 mg by mouth daily.    . fluticasone (FLONASE) 50 MCG/ACT nasal spray Place 1 spray into both nostrils daily as needed for  allergies or rhinitis.    Marland Kitchen isosorbide mononitrate (IMDUR) 60 MG 24 hr tablet Take 1 tablet (60mg ) by mouth in the morning and 1/2 tablet (30mg ) in the evening. 135 tablet 3  . levothyroxine (SYNTHROID, LEVOTHROID) 100 MCG tablet Take 100 mcg by mouth daily before breakfast.    . liraglutide (VICTOZA) 18 MG/3ML SOPN Inject 0.3 mLs (1.8 mg total) into the skin daily.    . metoprolol succinate (TOPROL-XL) 50 MG 24 hr tablet TAKE 1 TABLET BY MOUTH TWICE DAILY 60 tablet 4  . nitroGLYCERIN (NITROSTAT) 0.4 MG SL tablet Place 1 tablet (0.4 mg total) every 5 (five) minutes as needed under the tongue for chest pain. MAX 3 doses 25 tablet 3  . omeprazole (PRILOSEC) 40 MG capsule Take 1 capsule (40 mg total) by mouth 2 (two) times daily. 180 capsule 3  . pilocarpine (PILOCAR) 2 % ophthalmic solution Place 1 drop into the right eye 4 (four) times daily.    . prasugrel (EFFIENT) 10 MG TABS tablet Take 1 tablet (10 mg total) by mouth daily. 90 tablet 2  . prednisoLONE acetate (PRED FORTE) 1 % ophthalmic suspension Place 1 drop  into the left eye 3 (three) times daily.    Marland Kitchen PRESCRIPTION MEDICATION ByPap    . RANEXA 500 MG 12 hr tablet TAKE 2 TABLETS BY MOUTH TWICE DAILY 360 tablet 1  . tadalafil (CIALIS) 5 MG tablet Take 5 mg by mouth as needed for erectile dysfunction.    Marland Kitchen testosterone cypionate (DEPOTESTOTERONE CYPIONATE) 100 MG/ML injection Inject 100 mg into the muscle every 14 (fourteen) days. For IM use only     No current facility-administered medications for this visit.     Allergies:   Timolol and Other    Social History:  The patient  reports that  has never smoked. he has never used smokeless tobacco. He reports that he drinks about 0.6 oz of alcohol per week. He reports that he does not use drugs.   Family History:  The patient's family history includes CAD in his father; Colon cancer in his mother; Diabetes in his brother; Hypertension in his mother; Kidney disease in his father; Retinal  detachment in his brother; Spina bifida in his sister; Stroke in his father; Thyroid disease in his sister.    ROS: All other systems are reviewed and negative. Unless otherwise mentioned in H&P    PHYSICAL EXAM: VS:  BP 115/74   Pulse 64   Ht 5\' 10"  (1.778 m)   Wt 250 lb 9.6 oz (113.7 kg)   BMI 35.96 kg/m  , BMI Body mass index is 35.96 kg/m. GEN: Well nourished, well developed, in no acute distress obese HEENT: normal  Neck: no JVD, carotid bruits, or masses Cardiac: RRR; no murmurs, rubs, or gallops,no edema  Respiratory:  clear to auscultation bilaterally, normal work of breathing GI: soft, nontender, nondistended, + BS MS: no deformity or atrophy  Skin: warm and dry, no rash Neuro:  Strength and sensation are intact Psych: euthymic mood, full affect   EKG:  Normal sinus rhythm, mild LVH, heart rate is 64 bpm.  Recent Labs: 10/20/2016: BUN 12; Creatinine, Ser 1.19; Hemoglobin 17.5; Platelets 156; Potassium 4.1; Sodium 138    Lipid Panel    Component Value Date/Time   CHOL 115 02/28/2016 1119   TRIG 150 (H) 02/28/2016 1119   HDL 33 (L) 02/28/2016 1119   CHOLHDL 3.5 02/28/2016 1119   VLDL 30 02/28/2016 1119   LDLCALC 52 02/28/2016 1119      Wt Readings from Last 3 Encounters:  06/15/17 250 lb 9.6 oz (113.7 kg)  03/10/17 258 lb (117 kg)  02/16/17 262 lb 3.2 oz (118.9 kg)    Other studies Reviewed: Conclusion     Mid LAD lesion, 20 %stenosed.  The left ventricular systolic function is normal.  LV end diastolic pressure is normal.  The left ventricular ejection fraction is 55-65% by visual estimate.   1. No obstructive CAD. Patent stent in the mid LAD 2. Normal LV function 3. Normal LVEDP  Plan: consider other noncoronary causes of chest pain.    ASSESSMENT AND PLAN:  1. Coronary artery disease: A cardiac history as outlined above. Having recurrent chest discomfort and pressure. Most recent cardiac catheterization revealed patent stent to the LAD  with 20% stenosis otherwise. I discussed this with Dr. Gwenlyn Found who is DOB today. It is his recommendation to increase the dose of amlodipine from 2.5 mg to 5 mg daily, to help with angina symptoms and blood pressure control. He will continue all other medications include isosorbide, Ranexa, carvedilol. Prasugrel  2. Labile hypertension: The patient has been noted to be increasing blood  pressure over the last 2 weeks. He has had take clonidine which she normally takes when necessary almost daily now. Today blood pressure is low normal. We'll increase amlodipine to 5 mg daily as recommended by Dr. Gwenlyn Found he will have close follow-up with Dr. Debara Pickett.  3. Hyper-cholesterolemia: The statin therapy.   Current medicines are reviewed at length with the patient today.    Labs/ tests ordered today include: none Phill Myron. [place] Pugh, ANP, AACC   06/15/2017 11:52 AM    [facility] [address] [place], [place] [address] Phone: [phone]; Fax: [phone]

## 2017-06-15 ENCOUNTER — Ambulatory Visit (INDEPENDENT_AMBULATORY_CARE_PROVIDER_SITE_OTHER): Payer: BLUE CROSS/BLUE SHIELD | Admitting: Adult Health

## 2017-06-15 ENCOUNTER — Encounter: Payer: Self-pay | Admitting: Adult Health

## 2017-06-15 VITALS — BP 115/74 | HR 64 | Ht 70.0 in | Wt 250.6 lb

## 2017-06-15 DIAGNOSIS — I2510 Atherosclerotic heart disease of native coronary artery without angina pectoris: Secondary | ICD-10-CM

## 2017-06-15 DIAGNOSIS — I10 Essential (primary) hypertension: Secondary | ICD-10-CM

## 2017-06-15 DIAGNOSIS — R079 Chest pain, unspecified: Secondary | ICD-10-CM | POA: Diagnosis not present

## 2017-06-15 DIAGNOSIS — G8929 Other chronic pain: Secondary | ICD-10-CM

## 2017-06-15 DIAGNOSIS — I2583 Coronary atherosclerosis due to lipid rich plaque: Secondary | ICD-10-CM

## 2017-06-15 DIAGNOSIS — E7800 Pure hypercholesterolemia, unspecified: Secondary | ICD-10-CM

## 2017-06-15 MED ORDER — AMLODIPINE BESYLATE 5 MG PO TABS: ORAL_TABLET | ORAL | 3 refills | Status: DC

## 2017-06-15 NOTE — Patient Instructions (Signed)
Medication Instructions:  INCREASE AMLODIPINE 5MG  DAILY  If you need a refill on your cardiac medications before your next appointment, please call your pharmacy.  Follow-Up: Your physician wants you to follow-up in: [place].   Thank you for choosing [facility]!!

## 2017-06-21 ENCOUNTER — Ambulatory Visit: Payer: BLUE CROSS/BLUE SHIELD | Admitting: Neurology

## 2017-07-05 ENCOUNTER — Ambulatory Visit (INDEPENDENT_AMBULATORY_CARE_PROVIDER_SITE_OTHER): Payer: BLUE CROSS/BLUE SHIELD | Admitting: *Deleted

## 2017-07-05 ENCOUNTER — Telehealth: Payer: Self-pay | Admitting: Cardiology

## 2017-07-05 DIAGNOSIS — I495 Sick sinus syndrome: Secondary | ICD-10-CM | POA: Diagnosis not present

## 2017-07-05 NOTE — Telephone Encounter (Signed)
LMOVM reminding pt to send remote transmission.   

## 2017-07-06 NOTE — Progress Notes (Signed)
Remote pacemaker transmission.   

## 2017-07-07 ENCOUNTER — Encounter: Payer: Self-pay | Admitting: Cardiology

## 2017-07-14 ENCOUNTER — Ambulatory Visit (HOSPITAL_COMMUNITY)
Admission: AD | Admit: 2017-07-14 | Discharge: 2017-07-14 | Disposition: A | Discharge status: Home / Self Care | Payer: BLUE CROSS/BLUE SHIELD | Source: Ambulatory Visit | Attending: Ophthalmology | Admitting: Ophthalmology

## 2017-07-14 ENCOUNTER — Other Ambulatory Visit: Payer: Self-pay

## 2017-07-14 ENCOUNTER — Ambulatory Visit (HOSPITAL_COMMUNITY): Payer: BLUE CROSS/BLUE SHIELD | Admitting: Anesthesiology

## 2017-07-14 ENCOUNTER — Ambulatory Visit: Payer: Self-pay | Admitting: Ophthalmology

## 2017-07-14 ENCOUNTER — Encounter (HOSPITAL_COMMUNITY): Payer: Self-pay | Admitting: *Deleted

## 2017-07-14 ENCOUNTER — Encounter (HOSPITAL_COMMUNITY)
Admission: AD | Disposition: A | Discharge status: Home / Self Care | Payer: Self-pay | Source: Ambulatory Visit | Attending: Ophthalmology

## 2017-07-14 DIAGNOSIS — I2510 Atherosclerotic heart disease of native coronary artery without angina pectoris: Secondary | ICD-10-CM | POA: Diagnosis not present

## 2017-07-14 DIAGNOSIS — Z955 Presence of coronary angioplasty implant and graft: Secondary | ICD-10-CM | POA: Insufficient documentation

## 2017-07-14 DIAGNOSIS — G4733 Obstructive sleep apnea (adult) (pediatric): Secondary | ICD-10-CM | POA: Insufficient documentation

## 2017-07-14 DIAGNOSIS — I10 Essential (primary) hypertension: Secondary | ICD-10-CM | POA: Diagnosis not present

## 2017-07-14 DIAGNOSIS — Z950 Presence of cardiac pacemaker: Secondary | ICD-10-CM | POA: Diagnosis not present

## 2017-07-14 DIAGNOSIS — H3321 Serous retinal detachment, right eye: Secondary | ICD-10-CM | POA: Diagnosis present

## 2017-07-14 DIAGNOSIS — Z947 Corneal transplant status: Secondary | ICD-10-CM | POA: Insufficient documentation

## 2017-07-14 HISTORY — DX: Presence of cardiac pacemaker: Z95.0

## 2017-07-14 HISTORY — PX: PARS PLANA VITRECTOMY: SHX2166

## 2017-07-14 LAB — CBC
HCT: 50.7 % (ref 39.0–52.0)
Hemoglobin: 18.3 g/dL — ABNORMAL HIGH (ref 13.0–17.0)
MCH: 30.7 pg (ref 26.0–34.0)
MCHC: 36.1 g/dL — ABNORMAL HIGH (ref 30.0–36.0)
MCV: 85.1 fL (ref 78.0–100.0)
PLATELETS: 173 10*3/uL (ref 150–400)
RBC: 5.96 MIL/uL — AB (ref 4.22–5.81)
RDW: 13.4 % (ref 11.5–15.5)
WBC: 6.6 10*3/uL (ref 4.0–10.5)

## 2017-07-14 LAB — BASIC METABOLIC PANEL
Anion gap: 13 (ref 5–15)
BUN: 12 mg/dL (ref 6–20)
CHLORIDE: 102 mmol/L (ref 101–111)
CO2: 19 mmol/L — ABNORMAL LOW (ref 22–32)
CREATININE: 1.21 mg/dL (ref 0.61–1.24)
Calcium: 8.8 mg/dL — ABNORMAL LOW (ref 8.9–10.3)
GFR calc non Af Amer: >60 mL/min (ref >60)
Glucose, Bld: 85 mg/dL (ref 65–99)
Potassium: 5.1 mmol/L (ref 3.5–5.1)
Sodium: 134 mmol/L — ABNORMAL LOW (ref 135–145)

## 2017-07-14 LAB — PROTIME-INR
INR: 1.03
Prothrombin Time: 13.4 seconds (ref 11.4–15.2)

## 2017-07-14 SURGERY — PARS PLANA VITRECTOMY WITH 25 GAUGE
Anesthesia: Monitor Anesthesia Care | Site: Eye | Laterality: Right

## 2017-07-14 MED ORDER — EPINEPHRINE PF 1 MG/ML IJ SOLN
INTRAMUSCULAR | Status: AC
  Filled 2017-07-14: qty 1

## 2017-07-14 MED ORDER — ACETAZOLAMIDE ER 500 MG PO CP12
500.0000 mg | ORAL_CAPSULE | Freq: Once | ORAL | Status: AC
  Administered 2017-07-14: 500 mg via ORAL
  Filled 2017-07-14: qty 1

## 2017-07-14 MED ORDER — TOBRAMYCIN-DEXAMETHASONE 0.3-0.1 % OP OINT
TOPICAL_OINTMENT | OPHTHALMIC | Status: AC
  Filled 2017-07-14: qty 3.5

## 2017-07-14 MED ORDER — LIDOCAINE HCL 2 % IJ SOLN
INTRAMUSCULAR | Status: DC | PRN
  Administered 2017-07-14: 5.5 mL via RETROBULBAR

## 2017-07-14 MED ORDER — CYCLOPENTOLATE HCL 1 % OP SOLN
OPHTHALMIC | Status: AC
  Administered 2017-07-14: 1 [drp] via OPHTHALMIC
  Filled 2017-07-14: qty 2

## 2017-07-14 MED ORDER — PROPOFOL 10 MG/ML IV BOLUS
INTRAVENOUS | Status: AC
  Filled 2017-07-14: qty 20

## 2017-07-14 MED ORDER — BSS PLUS IO SOLN
INTRAOCULAR | Status: AC
  Filled 2017-07-14: qty 500

## 2017-07-14 MED ORDER — EPINEPHRINE PF 1 MG/ML IJ SOLN
INTRAOCULAR | Status: DC | PRN
  Administered 2017-07-14: 22:00:00

## 2017-07-14 MED ORDER — TETRACAINE HCL 0.5 % OP SOLN
OPHTHALMIC | Status: AC
  Filled 2017-07-14: qty 4

## 2017-07-14 MED ORDER — BSS IO SOLN
INTRAOCULAR | Status: AC
  Filled 2017-07-14: qty 15

## 2017-07-14 MED ORDER — FENTANYL CITRATE (PF) 250 MCG/5ML IJ SOLN
INTRAMUSCULAR | Status: AC
  Filled 2017-07-14: qty 5

## 2017-07-14 MED ORDER — MIDAZOLAM HCL 5 MG/5ML IJ SOLN
INTRAMUSCULAR | Status: DC | PRN
  Administered 2017-07-14 (×2): 1 mg via INTRAVENOUS

## 2017-07-14 MED ORDER — PHENYLEPHRINE HCL 10 % OP SOLN
OPHTHALMIC | Status: AC
  Administered 2017-07-14: 1 [drp] via OPHTHALMIC
  Filled 2017-07-14: qty 5

## 2017-07-14 MED ORDER — ATROPINE SULFATE 1 % OP SOLN
OPHTHALMIC | Status: AC
  Filled 2017-07-14: qty 5

## 2017-07-14 MED ORDER — DEXAMETHASONE SODIUM PHOSPHATE 10 MG/ML IJ SOLN
INTRAMUSCULAR | Status: DC | PRN
  Administered 2017-07-14: 10 mg

## 2017-07-14 MED ORDER — PHENYLEPHRINE HCL 10 % OP SOLN: 1.0000 [drp] | OPHTHALMIC | Status: DC | PRN

## 2017-07-14 MED ORDER — BUPIVACAINE HCL (PF) 0.75 % IJ SOLN
INTRAMUSCULAR | Status: AC
  Filled 2017-07-14: qty 10

## 2017-07-14 MED ORDER — HYPROMELLOSE (GONIOSCOPIC) 2.5 % OP SOLN
OPHTHALMIC | Status: AC
  Filled 2017-07-14: qty 15

## 2017-07-14 MED ORDER — SODIUM CHLORIDE 0.9 % IV SOLN
INTRAVENOUS | Status: DC
  Administered 2017-07-14: 19:00:00 via INTRAVENOUS

## 2017-07-14 MED ORDER — ONDANSETRON HCL 4 MG/2ML IJ SOLN
INTRAMUSCULAR | Status: DC | PRN
  Administered 2017-07-14: 4 mg via INTRAVENOUS

## 2017-07-14 MED ORDER — INDOCYANINE GREEN 25 MG IV SOLR
INTRAVENOUS | Status: AC
  Filled 2017-07-14: qty 25

## 2017-07-14 MED ORDER — CEFAZOLIN SUBCONJUNCTIVAL INJECTION 100 MG/0.5 ML
200.0000 mg | INJECTION | SUBCONJUNCTIVAL | Status: DC
  Filled 2017-07-14: qty 5

## 2017-07-14 MED ORDER — TOBRAMYCIN-DEXAMETHASONE 0.3-0.1 % OP OINT
TOPICAL_OINTMENT | OPHTHALMIC | Status: DC | PRN
  Administered 2017-07-14: 1 via OPHTHALMIC

## 2017-07-14 MED ORDER — LIDOCAINE 2% (20 MG/ML) 5 ML SYRINGE
INTRAMUSCULAR | Status: AC
  Filled 2017-07-14: qty 5

## 2017-07-14 MED ORDER — CYCLOPENTOLATE HCL 1 % OP SOLN
1.0000 [drp] | OPHTHALMIC | Status: AC | PRN
  Administered 2017-07-14 (×3): 1 [drp] via OPHTHALMIC

## 2017-07-14 MED ORDER — BSS IO SOLN
INTRAOCULAR | Status: DC | PRN
  Administered 2017-07-14: 15 mL via INTRAOCULAR

## 2017-07-14 MED ORDER — DEXAMETHASONE SODIUM PHOSPHATE 10 MG/ML IJ SOLN
INTRAMUSCULAR | Status: AC
  Filled 2017-07-14: qty 1

## 2017-07-14 MED ORDER — HYALURONIDASE HUMAN 150 UNIT/ML IJ SOLN
150.0000 [IU] | Freq: Once | INTRAMUSCULAR | Status: DC
  Filled 2017-07-14: qty 1

## 2017-07-14 MED ORDER — MIDAZOLAM HCL 2 MG/2ML IJ SOLN
INTRAMUSCULAR | Status: AC
Start: 2017-07-14 — End: ?
  Filled 2017-07-14: qty 2

## 2017-07-14 MED ORDER — LIDOCAINE HCL 2 % IJ SOLN
INTRAMUSCULAR | Status: AC
  Filled 2017-07-14: qty 20

## 2017-07-14 MED ORDER — HYPROMELLOSE (GONIOSCOPIC) 2.5 % OP SOLN
OPHTHALMIC | Status: DC | PRN
  Administered 2017-07-14: 20 [drp] via OPHTHALMIC

## 2017-07-14 MED ORDER — ONDANSETRON HCL 4 MG/2ML IJ SOLN
INTRAMUSCULAR | Status: AC
  Filled 2017-07-14: qty 2

## 2017-07-14 MED ORDER — PHENYLEPHRINE HCL 10 % OP SOLN
1.0000 [drp] | OPHTHALMIC | Status: AC | PRN
  Administered 2017-07-14 (×3): 1 [drp] via OPHTHALMIC

## 2017-07-14 MED ORDER — PROPOFOL 10 MG/ML IV BOLUS
INTRAVENOUS | Status: DC | PRN
  Administered 2017-07-14: 20 mg via INTRAVENOUS
  Administered 2017-07-14: 30 mg via INTRAVENOUS

## 2017-07-14 MED ORDER — CEFAZOLIN 200 MG/ML FOR DIALYSIS
INJECTION | INTRAOCULAR | Status: DC | PRN
  Administered 2017-07-14: .7 mL

## 2017-07-14 MED ORDER — OFLOXACIN 0.3 % OP SOLN
1.0000 [drp] | OPHTHALMIC | Status: AC | PRN
  Administered 2017-07-14 (×3): 1 [drp] via OPHTHALMIC

## 2017-07-14 MED ORDER — OFLOXACIN 0.3 % OP SOLN
OPHTHALMIC | Status: AC
  Administered 2017-07-14: 1 [drp] via OPHTHALMIC
  Filled 2017-07-14: qty 5

## 2017-07-14 MED ORDER — FENTANYL CITRATE (PF) 100 MCG/2ML IJ SOLN: 25.0000 ug | INTRAMUSCULAR | Status: DC | PRN

## 2017-07-14 SURGICAL SUPPLY — 53 items
APPLICATOR COTTON TIP 6IN STRL (MISCELLANEOUS) ×2 IMPLANT
BLADE MVR KNIFE 20G (BLADE) IMPLANT
BLADE STAB KNIFE 15DEG (BLADE) IMPLANT
CABLE BIPOLOR RESECTION CORD (MISCELLANEOUS) ×2 IMPLANT
CANNULA ANT CHAM MAIN (OPHTHALMIC RELATED) IMPLANT
CANNULA DUAL BORE 23G (CANNULA) IMPLANT
CANNULA DUALBORE 25G (CANNULA) IMPLANT
CANNULA VLV SOFT TIP 25GA (OPHTHALMIC) ×2 IMPLANT
CAUTERY EYE LOW TEMP 1300F FIN (OPHTHALMIC RELATED) IMPLANT
CLSR STERI-STRIP ANTIMIC 1/2X4 (GAUZE/BANDAGES/DRESSINGS) ×2 IMPLANT
COVER MAYO STAND STRL (DRAPES) ×2 IMPLANT
DRAPE HALF SHEET 40X57 (DRAPES) ×2 IMPLANT
DRAPE INCISE 51X51 W/FILM STRL (DRAPES) IMPLANT
DRAPE RETRACTOR (MISCELLANEOUS) ×2 IMPLANT
ERASER HMR WETFIELD 23G BP (MISCELLANEOUS) IMPLANT
FORCEPS ECKARDT ILM 25G SERR (OPHTHALMIC RELATED) IMPLANT
FORCEPS GRIESHABER ILM 25G A (INSTRUMENTS) IMPLANT
GAS AUTO FILL CONSTEL (OPHTHALMIC) ×2
GAS AUTO FILL CONSTELLATION (OPHTHALMIC) ×1 IMPLANT
GLOVE ECLIPSE 7.5 STRL STRAW (GLOVE) ×2 IMPLANT
GOWN STRL REUS W/ TWL LRG LVL3 (GOWN DISPOSABLE) ×2 IMPLANT
GOWN STRL REUS W/TWL LRG LVL3 (GOWN DISPOSABLE) ×2
KIT BASIN OR (CUSTOM PROCEDURE TRAY) ×2 IMPLANT
KIT TURNOVER KIT B (KITS) ×2 IMPLANT
LENS BIOM SUPER VIEW SET DISP (OPHTHALMIC RELATED) ×2 IMPLANT
MICROPICK 25G (MISCELLANEOUS)
NEEDLE 18GX1X1/2 (RX/OR ONLY) (NEEDLE) ×2 IMPLANT
NEEDLE 25GX 5/8IN NON SAFETY (NEEDLE) ×2 IMPLANT
NEEDLE FILTER BLUNT 18X 1/2SAF (NEEDLE) ×1
NEEDLE FILTER BLUNT 18X1 1/2 (NEEDLE) ×1 IMPLANT
NEEDLE HYPO 25GX1X1/2 BEV (NEEDLE) IMPLANT
NEEDLE HYPO 30X.5 LL (NEEDLE) ×4 IMPLANT
NEEDLE RETROBULBAR 25GX1.5 (NEEDLE) ×4 IMPLANT
NS IRRIG 1000ML POUR BTL (IV SOLUTION) ×2 IMPLANT
PACK FRAGMATOME (OPHTHALMIC) IMPLANT
PACK VITRECTOMY CUSTOM (CUSTOM PROCEDURE TRAY) ×2 IMPLANT
PAD ARMBOARD 7.5X6 YLW CONV (MISCELLANEOUS) ×4 IMPLANT
PAK PIK VITRECTOMY CVS 25GA (OPHTHALMIC) ×2 IMPLANT
PENCIL BIPOLAR 25GA STR DISP (OPHTHALMIC RELATED) ×2 IMPLANT
PICK MICROPICK 25G (MISCELLANEOUS) IMPLANT
PROBE LASER ILLUM FLEX CVD 25G (OPHTHALMIC) ×2 IMPLANT
ROLLS DENTAL (MISCELLANEOUS) IMPLANT
SCRAPER DIAMOND 25GA (OPHTHALMIC RELATED) IMPLANT
SOLUTION ANTI FOG 6CC (MISCELLANEOUS) ×2 IMPLANT
STOPCOCK 4 WAY LG BORE MALE ST (IV SETS) IMPLANT
SUT VICRYL 7 0 TG140 8 (SUTURE) ×2 IMPLANT
SUT VICRYL 8 0 TG140 8 (SUTURE) IMPLANT
SYR 10ML LL (SYRINGE) IMPLANT
SYR 20CC LL (SYRINGE) ×2 IMPLANT
SYR 5ML LL (SYRINGE) ×2 IMPLANT
SYR TB 1ML LUER SLIP (SYRINGE) IMPLANT
WATER STERILE IRR 1000ML POUR (IV SOLUTION) ×2 IMPLANT
WIPE INSTRUMENT VISIWIPE 73X73 (MISCELLANEOUS) IMPLANT

## 2017-07-14 NOTE — Brief Op Note (Signed)
07/14/2017  10:46 PM  PATIENT:  Thomas Walsh  62 y.o. male  PRE-OPERATIVE DIAGNOSIS:  retinal detachment right eye  POST-OPERATIVE DIAGNOSIS:  retinal detachment right eye  PROCEDURE:  Procedure(s): RETINAL DETACHMENT REPAIR RIGHT EYE TWENTY-FIVE GAUGE VITRECTOMY WITH ENDOLASER AND GAS (Right)  SURGEON:  Surgeon(s) and Role:    Jalene Mullet, MD - Primary  PHYSICIAN ASSISTANT:   ASSISTANTS: none   ANESTHESIA:   local and MAC  EBL:  minimal   BLOOD ADMINISTERED:none  DRAINS: none   LOCAL MEDICATIONS USED:  MARCAINE    and LIDOCAINE   SPECIMEN:  No Specimen  DISPOSITION OF SPECIMEN:  N/A  COUNTS:  YES  TOURNIQUET:  * No tourniquets in log *  DICTATION: .Note written in EPIC  PLAN OF CARE: Discharge to home after PACU  PATIENT DISPOSITION:  PACU - hemodynamically stable.   Delay start of Pharmacological VTE agent (>24hrs) due to surgical blood loss or risk of bleeding: not applicable

## 2017-07-14 NOTE — Anesthesia Preprocedure Evaluation (Signed)
Anesthesia Evaluation  Patient identified by MRN, date of birth, ID band Patient awake    Reviewed: Allergy & Precautions, NPO status , Patient's Chart, lab work & pertinent test results  Airway Mallampati: II  TM Distance: >3 FB Neck ROM: Full    Dental  (+) Dental Advisory Given   Pulmonary asthma , sleep apnea and Continuous Positive Airway Pressure Ventilation ,    breath sounds clear to auscultation       Cardiovascular hypertension, Pt. on medications and Pt. on home beta blockers + CAD and + Cardiac Stents  + pacemaker  Rhythm:Regular Rate:Normal     Neuro/Psych negative neurological ROS     GI/Hepatic Neg liver ROS, GERD  ,  Endo/Other  Hypothyroidism   Renal/GU negative Renal ROS     Musculoskeletal   Abdominal   Peds  Hematology negative hematology ROS (+)   Anesthesia Other Findings   Reproductive/Obstetrics                             Lab Results  Component Value Date   WBC 6.2 10/20/2016   HGB 17.5 (H) 10/20/2016   HCT 49.1 10/20/2016   MCV 82.7 10/20/2016   PLT 156 10/20/2016   Lab Results  Component Value Date   CREATININE 1.19 10/20/2016   BUN 12 10/20/2016   NA 138 10/20/2016   K 4.1 10/20/2016   CL 104 10/20/2016   CO2 25 10/20/2016    Anesthesia Physical Anesthesia Plan  ASA: III  Anesthesia Plan: MAC   Post-op Pain Management:    Induction: Intravenous  PONV Risk Score and Plan: 1 and Propofol infusion, Ondansetron and Treatment may vary due to age or medical condition  Airway Management Planned: Natural Airway and Nasal Cannula  Additional Equipment:   Intra-op Plan:   Post-operative Plan:   Informed Consent: I have reviewed the patients History and Physical, chart, labs and discussed the procedure including the risks, benefits and alternatives for the proposed anesthesia with the patient or authorized representative who has indicated  his/her understanding and acceptance.     Plan Discussed with: CRNA  Anesthesia Plan Comments:         Anesthesia Quick Evaluation

## 2017-07-14 NOTE — H&P (View-Only) (Signed)
  Date of examination:  07/14/17  Indication for surgery: retinal detachment right eye  Pertinent past medical history:  Past Medical History:  Diagnosis Date  . Asthma   . 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 Coronary artery disease involving native coronary artery of native heart with unstable angina pectoris ([place]) 02/28/2016  . Essential hypertension 02/28/2016  . GERD (gastroesophageal reflux disease)   . HLD (hyperlipidemia)   . 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 OSA treated with BiPAP   . PAF (paroxysmal atrial fibrillation) ([place]) 02/28/2016  . PCO (posterior capsular opacification)   . Presence of permanent cardiac pacemaker   . Pseudophakia of both eyes   . S/P bilateral cataract extraction 11/21/2013  . S/P placement of cardiac pacemaker 02/28/2016   [employer]  . S/P primary angioplasty with coronary stent 02/28/2016  . Secondary corneal edema of left eye   . Status post corneal transplant   . Thyroid disease     Pertinent ocular history:  Glaucoma, myopia  Pertinent family history:  Family History  Problem Relation Age of Onset  . Colon cancer Mother   . Hypertension Mother   . Kidney disease Father   . CAD Father   . Stroke Father   . Spina bifida Sister   . Thyroid disease Sister   . Diabetes Brother   . Retinal detachment Brother     General:  Healthy appearing patient in no distress.   Eyes:    Acuity OD 20/50    External: Within normal limits    Anterior segment: Within normal limits      Fundus: retinal detachment right eye    Impression:  retinal detachment right eye  Plan: Retinal detachment repair right eye  [name]

## 2017-07-14 NOTE — Transfer of Care (Signed)
Immediate Anesthesia Transfer of Care Note  Patient: Thomas Walsh  Procedure(s) Performed: RETINAL DETACHMENT REPAIR RIGHT EYE TWENTY-FIVE GAUGE VITRECTOMY WITH ENDOLASER AND GAS (Right Eye)  Patient Location: PACU  Anesthesia Type:MAC  Level of Consciousness: awake, alert , oriented and patient cooperative  Airway & Oxygen Therapy: Patient Spontanous Breathing and Patient connected to nasal cannula oxygen  Post-op Assessment: Report given to RN and Post -op Vital signs reviewed and stable  Post vital signs: Reviewed  Last Vitals:  Vitals Value Taken Time  BP 137/74 07/14/2017 10:48 PM  Temp 36.6 C 07/14/2017 10:48 PM  Pulse 68 07/14/2017 10:53 PM  Resp 9 07/14/2017 10:53 PM  SpO2 99 % 07/14/2017 10:53 PM  Vitals shown include unvalidated device data.  Last Pain:  Vitals:   07/14/17 2248  TempSrc:   PainSc: 0-No pain      Patients Stated Pain Goal: 0 (68/34/19 6222)  Complications: No apparent anesthesia complications

## 2017-07-14 NOTE — H&P (Signed)
  Date of examination:  07/14/17  Indication for surgery: retinal detachment right eye  Pertinent past medical history:  Past Medical History:  Diagnosis Date  . Asthma   . 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 Coronary artery disease involving native coronary artery of native heart with unstable angina pectoris ([place]) 02/28/2016  . Essential hypertension 02/28/2016  . GERD (gastroesophageal reflux disease)   . HLD (hyperlipidemia)   . 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 OSA treated with BiPAP   . PAF (paroxysmal atrial fibrillation) ([place]) 02/28/2016  . PCO (posterior capsular opacification)   . Presence of permanent cardiac pacemaker   . Pseudophakia of both eyes   . S/P bilateral cataract extraction 11/21/2013  . S/P placement of cardiac pacemaker 02/28/2016   [employer]  . S/P primary angioplasty with coronary stent 02/28/2016  . Secondary corneal edema of left eye   . Status post corneal transplant   . Thyroid disease     Pertinent ocular history:  Glaucoma, myopia  Pertinent family history:  Family History  Problem Relation Age of Onset  . Colon cancer Mother   . Hypertension Mother   . Kidney disease Father   . CAD Father   . Stroke Father   . Spina bifida Sister   . Thyroid disease Sister   . Diabetes Brother   . Retinal detachment Brother     General:  Healthy appearing patient in no distress.   Eyes:    Acuity OD 20/50    External: Within normal limits    Anterior segment: Within normal limits      Fundus: retinal detachment right eye    Impression:  retinal detachment right eye  Plan: Retinal detachment repair right eye  [name]

## 2017-07-14 NOTE — Discharge Instructions (Signed)
DO NOT SLEEP ON BACK, THE EYE PRESSURE CAN GO UP AND CAUSE VISION LOSS   SLEEP ON SIDE WITH NOSE TO PILLOW  DURING DAY KEEP FACE DOWN.  15 MINUTES EVERY 2 HOURS IT IS OK TO LOOK STRAIGHT AHEAD (USE BATHROOM, EAT, WALK, ETC.)  

## 2017-07-14 NOTE — Op Note (Signed)
Saylor Sheckler 07/14/2017 Diagnosis: retinal detachment right eye   Procedure: [place] Vitrectomy, Endolaser, Fluid Gas Exchange and C3F8 gas Operative Eye:  right eye  Surgeon: [name] Estimated Blood Loss: minimal Specimens for Pathology:  None Complications: none   The  patient was prepped and draped in the usual fashion for ocular surgery on the  right eye .  A lid speculum was placed.  Infusion line and trocar was placed at the 8 o'clock position approximately 3.5 mm from the surgical limbus.   The infusion line was allowed to run and then clamped when placed at the cannula opening. The line was inserted and secured to the drape with an adhesive strip.   Active trocars/cannula were placed at the 10 and 2 o'clock positions approximately 3.5 mm from the surgical limbus. The cannula was visualized in the vitreous cavity.  The light pipe and vitreous cutter were inserted into the vitreous cavity and a core vitrectomy was performed.  Care taken to remove the vitreous up to the vitreous base for 360 degrees.   Scleral depression was used to shave the vitreous base 360 degrees and surrounding the breaks at 6 o'clock and 10 o'clock.  Endocautery was used to make a retinotomy inferior to the disc.  The soft tipped extrusion canula was used to drain subretinal fluid.  A complete air/fluid exchange was performed.   3 rows of endolaser were applied around the breaks and 360 degrees to the periphery.  14% C3F8 gas was injected in the eye.    The superior cannulas were sequentially removed with concommitant tamponade using a cotton tipped applicator and noted to be air tight.  The infusion line and trocar were removed and the sclerotomy was noted to be air tight with normal intraocular pressure by digital palpapation.  Subconjunctival injections of  Dexamethasone 4mg /47ml was placed in the infero-medial quadrant.   The speculum and drapes were removed and the eye was patched with  Polymixin/Bacitracin ophthalmic ointment. An eye shield was placed and the patient was transferred alert and conversant with stable vital signs to the post operative recovery area.  The patient tolerated the procedure well and no complications were noted.  [name] MD

## 2017-07-14 NOTE — Anesthesia Procedure Notes (Signed)
Procedure Name: MAC Date/Time: 07/14/2017 9:12 PM Performed by: Jenne Campus, CRNA Pre-anesthesia Checklist: Patient identified, Emergency Drugs available, Suction available and Patient being monitored Oxygen Delivery Method: Nasal cannula

## 2017-07-15 ENCOUNTER — Encounter (HOSPITAL_COMMUNITY): Payer: Self-pay | Admitting: Ophthalmology

## 2017-07-15 NOTE — Anesthesia Postprocedure Evaluation (Signed)
Anesthesia Post Note  Patient: Thomas Walsh  Procedure(s) Performed: RETINAL DETACHMENT REPAIR RIGHT EYE TWENTY-FIVE GAUGE VITRECTOMY WITH ENDOLASER AND GAS (Right Eye)     Patient location during evaluation: PACU Anesthesia Type: MAC Level of consciousness: awake and alert Pain management: pain level controlled Vital Signs Assessment: post-procedure vital signs reviewed and stable Respiratory status: spontaneous breathing, nonlabored ventilation, respiratory function stable and patient connected to nasal cannula oxygen Cardiovascular status: stable and blood pressure returned to baseline Postop Assessment: no apparent nausea or vomiting Anesthetic complications: no    Last Vitals:  Vitals:   07/14/17 2315 07/14/17 2330  BP: 122/70 122/70  Pulse: 64   Resp: 18 18  Temp:    SpO2: 96% 96%    Last Pain:  Vitals:   07/14/17 2315  TempSrc:   PainSc: 0-No pain                 Tiajuana Amass

## 2017-07-17 DIAGNOSIS — E291 Testicular hypofunction: Secondary | ICD-10-CM | POA: Insufficient documentation

## 2017-07-17 DIAGNOSIS — E119 Type 2 diabetes mellitus without complications: Secondary | ICD-10-CM | POA: Insufficient documentation

## 2017-07-17 DIAGNOSIS — I519 Heart disease, unspecified: Secondary | ICD-10-CM | POA: Insufficient documentation

## 2017-07-19 ENCOUNTER — Other Ambulatory Visit: Payer: Self-pay | Admitting: Internal Medicine

## 2017-07-20 LAB — CUP PACEART REMOTE DEVICE CHECK
Battery Remaining Longevity: 97 mo
Battery Voltage: 3.02 V
Brady Statistic AP VP Percent: 0.02 %
Brady Statistic AP VS Percent: 23.28 %
Brady Statistic AS VS Percent: 76.67 %
Brady Statistic RV Percent Paced: 0.05 %
Implantable Lead Implant Date: 20160721
Implantable Lead Location: 753859
Implantable Lead Model: 5076
Lead Channel Impedance Value: 418 Ohm
Lead Channel Impedance Value: 513 Ohm
Lead Channel Impedance Value: 570 Ohm
Lead Channel Pacing Threshold Amplitude: 0.75 V
Lead Channel Pacing Threshold Amplitude: 0.75 V
Lead Channel Pacing Threshold Pulse Width: 0.4 ms
Lead Channel Sensing Intrinsic Amplitude: 6.375 mV
Lead Channel Sensing Intrinsic Amplitude: 6.375 mV
Lead Channel Sensing Intrinsic Amplitude: 6.375 mV
Lead Channel Setting Pacing Amplitude: 2 V
Lead Channel Setting Pacing Pulse Width: 0.4 ms
Lead Channel Setting Sensing Sensitivity: 0.9 mV
MDC IDC LEAD IMPLANT DT: 20160721
MDC IDC LEAD LOCATION: 753860
MDC IDC MSMT LEADCHNL RA PACING THRESHOLD PULSEWIDTH: 0.4 ms
MDC IDC MSMT LEADCHNL RV IMPEDANCE VALUE: 437 Ohm
MDC IDC MSMT LEADCHNL RV SENSING INTR AMPL: 6.375 mV
MDC IDC PG IMPLANT DT: 20160721
MDC IDC SESS DTM: 20190409021058
MDC IDC SET LEADCHNL RA PACING AMPLITUDE: 1.5 V
MDC IDC STAT BRADY AS VP PERCENT: 0.03 %
MDC IDC STAT BRADY RA PERCENT PACED: 23.29 %

## 2017-07-20 NOTE — Telephone Encounter (Signed)
REFILL 

## 2017-07-22 DIAGNOSIS — N135 Crossing vessel and stricture of ureter without hydronephrosis: Secondary | ICD-10-CM | POA: Insufficient documentation

## 2017-07-29 ENCOUNTER — Ambulatory Visit: Payer: Self-pay | Admitting: Ophthalmology

## 2017-07-29 NOTE — Progress Notes (Signed)
Left pre-op instructions on pt spouse, Thomas Walsh [facility]) voic mailbox according to pre-op checklist. Pt made aware to not take Victoza on DOS. Pt made aware to check BG every 2 hours prior to arrival to hospital on DOS. Pt made aware to treat a BG < 70 with 4 glucose tabs or glucose gel or 4 ounces of apple or cranberry juice, wait 15 minutes after intervention to recheck BG, if BG remains < 70, call Short Stay unit to speak with a nurse. Pt made aware to follow doctors instructions regarding Effient and Aspirin. Please complete assessment on DOS.

## 2017-07-30 ENCOUNTER — Ambulatory Visit (HOSPITAL_COMMUNITY): Payer: BLUE CROSS/BLUE SHIELD | Admitting: Certified Registered"

## 2017-07-30 ENCOUNTER — Other Ambulatory Visit: Payer: Self-pay

## 2017-07-30 ENCOUNTER — Ambulatory Visit (HOSPITAL_COMMUNITY)
Admission: RE | Admit: 2017-07-30 | Discharge: 2017-07-30 | Disposition: A | Discharge status: Home / Self Care | Payer: BLUE CROSS/BLUE SHIELD | Source: Ambulatory Visit | Attending: Ophthalmology | Admitting: Ophthalmology

## 2017-07-30 ENCOUNTER — Encounter (HOSPITAL_COMMUNITY): Payer: Self-pay

## 2017-07-30 ENCOUNTER — Encounter (HOSPITAL_COMMUNITY)
Admission: RE | Disposition: A | Discharge status: Home / Self Care | Payer: Self-pay | Source: Ambulatory Visit | Attending: Ophthalmology

## 2017-07-30 DIAGNOSIS — I2510 Atherosclerotic heart disease of native coronary artery without angina pectoris: Secondary | ICD-10-CM | POA: Insufficient documentation

## 2017-07-30 DIAGNOSIS — Z9989 Dependence on other enabling machines and devices: Secondary | ICD-10-CM | POA: Diagnosis not present

## 2017-07-30 DIAGNOSIS — Z79899 Other long term (current) drug therapy: Secondary | ICD-10-CM | POA: Diagnosis not present

## 2017-07-30 DIAGNOSIS — E039 Hypothyroidism, unspecified: Secondary | ICD-10-CM | POA: Insufficient documentation

## 2017-07-30 DIAGNOSIS — Z950 Presence of cardiac pacemaker: Secondary | ICD-10-CM | POA: Insufficient documentation

## 2017-07-30 DIAGNOSIS — G4733 Obstructive sleep apnea (adult) (pediatric): Secondary | ICD-10-CM | POA: Diagnosis not present

## 2017-07-30 DIAGNOSIS — I10 Essential (primary) hypertension: Secondary | ICD-10-CM | POA: Diagnosis not present

## 2017-07-30 DIAGNOSIS — Z7982 Long term (current) use of aspirin: Secondary | ICD-10-CM | POA: Insufficient documentation

## 2017-07-30 DIAGNOSIS — H40051 Ocular hypertension, right eye: Secondary | ICD-10-CM | POA: Diagnosis present

## 2017-07-30 DIAGNOSIS — K219 Gastro-esophageal reflux disease without esophagitis: Secondary | ICD-10-CM | POA: Insufficient documentation

## 2017-07-30 DIAGNOSIS — J45909 Unspecified asthma, uncomplicated: Secondary | ICD-10-CM | POA: Insufficient documentation

## 2017-07-30 DIAGNOSIS — E785 Hyperlipidemia, unspecified: Secondary | ICD-10-CM | POA: Diagnosis not present

## 2017-07-30 DIAGNOSIS — Z947 Corneal transplant status: Secondary | ICD-10-CM | POA: Insufficient documentation

## 2017-07-30 DIAGNOSIS — Z888 Allergy status to other drugs, medicaments and biological substances status: Secondary | ICD-10-CM | POA: Diagnosis not present

## 2017-07-30 DIAGNOSIS — Z8249 Family history of ischemic heart disease and other diseases of the circulatory system: Secondary | ICD-10-CM | POA: Diagnosis not present

## 2017-07-30 DIAGNOSIS — I480 Paroxysmal atrial fibrillation: Secondary | ICD-10-CM | POA: Diagnosis not present

## 2017-07-30 DIAGNOSIS — Z6835 Body mass index (BMI) 35.0-35.9, adult: Secondary | ICD-10-CM | POA: Diagnosis not present

## 2017-07-30 DIAGNOSIS — Z955 Presence of coronary angioplasty implant and graft: Secondary | ICD-10-CM | POA: Diagnosis not present

## 2017-07-30 HISTORY — DX: Ocular hypertension, right eye: H40.051

## 2017-07-30 HISTORY — PX: AIR/FLUID EXCHANGE: SHX6494

## 2017-07-30 LAB — CBC
HEMATOCRIT: 52.1 % — AB (ref 39.0–52.0)
Hemoglobin: 18.6 g/dL — ABNORMAL HIGH (ref 13.0–17.0)
MCH: 30.1 pg (ref 26.0–34.0)
MCHC: 35.7 g/dL (ref 30.0–36.0)
MCV: 84.4 fL (ref 78.0–100.0)
PLATELETS: 179 10*3/uL (ref 150–400)
RBC: 6.17 MIL/uL — ABNORMAL HIGH (ref 4.22–5.81)
RDW: 14.5 % (ref 11.5–15.5)
WBC: 7.8 10*3/uL (ref 4.0–10.5)

## 2017-07-30 LAB — BASIC METABOLIC PANEL
Anion gap: 8 (ref 5–15)
BUN: 18 mg/dL (ref 6–20)
CALCIUM: 8.7 mg/dL — AB (ref 8.9–10.3)
CO2: 20 mmol/L — ABNORMAL LOW (ref 22–32)
Chloride: 111 mmol/L (ref 101–111)
Creatinine, Ser: 1.13 mg/dL (ref 0.61–1.24)
GFR calc Af Amer: >60 mL/min (ref >60)
Glucose, Bld: 110 mg/dL — ABNORMAL HIGH (ref 65–99)
POTASSIUM: 3.6 mmol/L (ref 3.5–5.1)
SODIUM: 139 mmol/L (ref 135–145)

## 2017-07-30 LAB — GLUCOSE, CAPILLARY: GLUCOSE-CAPILLARY: 130 mg/dL — AB (ref 65–99)

## 2017-07-30 LAB — PROTIME-INR
INR: 1.12
PROTHROMBIN TIME: 14.3 s (ref 11.4–15.2)

## 2017-07-30 SURGERY — AIR/FLUID EXCHANGE
Anesthesia: Monitor Anesthesia Care | Site: Eye | Laterality: Right

## 2017-07-30 MED ORDER — MEPERIDINE HCL 50 MG/ML IJ SOLN: 6.2500 mg | INTRAMUSCULAR | Status: DC | PRN

## 2017-07-30 MED ORDER — MIDAZOLAM HCL 2 MG/2ML IJ SOLN: 0.5000 mg | Freq: Once | INTRAMUSCULAR | Status: DC | PRN

## 2017-07-30 MED ORDER — BSS IO SOLN
INTRAOCULAR | Status: DC | PRN
  Administered 2017-07-30: 15 mL via INTRAOCULAR

## 2017-07-30 MED ORDER — BSS IO SOLN
INTRAOCULAR | Status: AC
  Filled 2017-07-30: qty 15

## 2017-07-30 MED ORDER — LIDOCAINE HCL 2 % IJ SOLN
INTRAMUSCULAR | Status: AC
  Filled 2017-07-30: qty 20

## 2017-07-30 MED ORDER — PROMETHAZINE HCL 25 MG/ML IJ SOLN: 6.2500 mg | INTRAMUSCULAR | Status: DC | PRN

## 2017-07-30 MED ORDER — PROPOFOL 10 MG/ML IV BOLUS
INTRAVENOUS | Status: AC
  Filled 2017-07-30: qty 20

## 2017-07-30 MED ORDER — ONDANSETRON HCL 4 MG/2ML IJ SOLN
INTRAMUSCULAR | Status: AC
  Filled 2017-07-30: qty 6

## 2017-07-30 MED ORDER — TROPICAMIDE 1 % OP SOLN
1.0000 [drp] | OPHTHALMIC | Status: AC | PRN
  Administered 2017-07-30 (×3): 1 [drp] via OPHTHALMIC
  Filled 2017-07-30: qty 15

## 2017-07-30 MED ORDER — TETRACAINE HCL 0.5 % OP SOLN
OPHTHALMIC | Status: AC
  Filled 2017-07-30: qty 4

## 2017-07-30 MED ORDER — BUPIVACAINE HCL (PF) 0.75 % IJ SOLN
INTRAMUSCULAR | Status: AC
  Filled 2017-07-30: qty 10

## 2017-07-30 MED ORDER — SODIUM CHLORIDE 0.9 % IV SOLN
INTRAVENOUS | Status: DC
  Administered 2017-07-30: 13:00:00 via INTRAVENOUS

## 2017-07-30 MED ORDER — LIDOCAINE HCL 2 % IJ SOLN
INTRAMUSCULAR | Status: DC | PRN
  Administered 2017-07-30: 1.5 mL

## 2017-07-30 MED ORDER — HYALURONIDASE HUMAN 150 UNIT/ML IJ SOLN
INTRAMUSCULAR | Status: AC
  Filled 2017-07-30: qty 1

## 2017-07-30 MED ORDER — NEOMYCIN-POLYMYXIN-DEXAMETH 3.5-10000-0.1 OP OINT
TOPICAL_OINTMENT | OPHTHALMIC | Status: AC
  Filled 2017-07-30: qty 3.5

## 2017-07-30 MED ORDER — NEOMYCIN-POLYMYXIN-DEXAMETH 3.5-10000-0.1 OP OINT
TOPICAL_OINTMENT | OPHTHALMIC | Status: DC | PRN
  Administered 2017-07-30: 1 via OPHTHALMIC

## 2017-07-30 MED ORDER — ONDANSETRON HCL 4 MG/2ML IJ SOLN
INTRAMUSCULAR | Status: AC
  Filled 2017-07-30: qty 2

## 2017-07-30 MED ORDER — LIDOCAINE 2% (20 MG/ML) 5 ML SYRINGE
INTRAMUSCULAR | Status: AC
  Filled 2017-07-30: qty 5

## 2017-07-30 MED ORDER — ROCURONIUM BROMIDE 50 MG/5ML IV SOLN
INTRAVENOUS | Status: AC
  Filled 2017-07-30: qty 1

## 2017-07-30 MED ORDER — MIDAZOLAM HCL 2 MG/2ML IJ SOLN
INTRAMUSCULAR | Status: AC
  Filled 2017-07-30: qty 2

## 2017-07-30 MED ORDER — DEXAMETHASONE SODIUM PHOSPHATE 10 MG/ML IJ SOLN
INTRAMUSCULAR | Status: AC
  Filled 2017-07-30: qty 1

## 2017-07-30 MED ORDER — FENTANYL CITRATE (PF) 250 MCG/5ML IJ SOLN
INTRAMUSCULAR | Status: DC | PRN
  Administered 2017-07-30: 25 ug via INTRAVENOUS

## 2017-07-30 MED ORDER — ONDANSETRON HCL 4 MG/2ML IJ SOLN
INTRAMUSCULAR | Status: DC | PRN
  Administered 2017-07-30: 4 mg via INTRAVENOUS

## 2017-07-30 MED ORDER — FENTANYL CITRATE (PF) 250 MCG/5ML IJ SOLN
INTRAMUSCULAR | Status: AC
  Filled 2017-07-30: qty 5

## 2017-07-30 MED ORDER — MIDAZOLAM HCL 5 MG/5ML IJ SOLN
INTRAMUSCULAR | Status: DC | PRN
  Administered 2017-07-30 (×4): 0.5 mg via INTRAVENOUS

## 2017-07-30 MED ORDER — POVIDONE-IODINE 5 % OP SOLN
OPHTHALMIC | Status: DC | PRN
  Administered 2017-07-30: 1 via OPHTHALMIC

## 2017-07-30 SURGICAL SUPPLY — 18 items
APPLICATOR COTTON TIP 6IN STRL (MISCELLANEOUS) ×2 IMPLANT
DRAPE HALF SHEET 40X57 (DRAPES) ×2 IMPLANT
FILTER BLUE MILLIPORE (MISCELLANEOUS) ×2 IMPLANT
GLOVE ECLIPSE 7.5 STRL STRAW (GLOVE) ×2 IMPLANT
GOWN STRL NON-REIN LRG LVL3 (GOWN DISPOSABLE) ×6 IMPLANT
GOWN STRL REUS W/ TWL LRG LVL3 (GOWN DISPOSABLE) ×1 IMPLANT
GOWN STRL REUS W/TWL LRG LVL3 (GOWN DISPOSABLE) ×1
KIT BASIN OR (CUSTOM PROCEDURE TRAY) ×2 IMPLANT
KIT TURNOVER KIT B (KITS) ×2 IMPLANT
NEEDLE FILTER BLUNT 18X 1/2SAF (NEEDLE) ×1
NEEDLE FILTER BLUNT 18X1 1/2 (NEEDLE) ×1 IMPLANT
NEEDLE HYPO 25GX1X1/2 BEV (NEEDLE) ×2 IMPLANT
NEEDLE HYPO 30X.5 LL (NEEDLE) ×2 IMPLANT
NS IRRIG 1000ML POUR BTL (IV SOLUTION) ×2 IMPLANT
PACK CATARACT CUSTOM (CUSTOM PROCEDURE TRAY) ×2 IMPLANT
PAD ARMBOARD 7.5X6 YLW CONV (MISCELLANEOUS) ×4 IMPLANT
TOWEL OR 17X24 6PK STRL BLUE (TOWEL DISPOSABLE) IMPLANT
WATER STERILE IRR 1000ML POUR (IV SOLUTION) ×2 IMPLANT

## 2017-07-30 NOTE — Anesthesia Procedure Notes (Signed)
Date/Time: 07/30/2017 2:40 PM Performed by: Wilburn Cornelia, CRNA Pre-anesthesia Checklist: Patient identified, Emergency Drugs available, Suction available, Patient being monitored and Timeout performed Oxygen Delivery Method: Nasal cannula Placement Confirmation: positive ETCO2 and breath sounds checked- equal and bilateral Dental Injury: Teeth and Oropharynx as per pre-operative assessment

## 2017-07-30 NOTE — Transfer of Care (Signed)
Immediate Anesthesia Transfer of Care Note  Patient: Thomas Walsh  Procedure(s) Performed: AIR/FLUID EXCHANGE (Right Eye)  Patient Location: PACU  Anesthesia Type:MAC  Level of Consciousness: awake, alert  and oriented  Airway & Oxygen Therapy: Patient Spontanous Breathing  Post-op Assessment: Report given to RN and Post -op Vital signs reviewed and stable  Post vital signs: Reviewed and stable  Last Vitals:  Vitals Value Taken Time  BP    Temp    Pulse 60 07/30/2017  3:37 PM  Resp 16 07/30/2017  3:37 PM  SpO2 96 % 07/30/2017  3:37 PM  Vitals shown include unvalidated device data.  Last Pain:  Vitals:   07/30/17 1236  TempSrc:   PainSc: 0-No pain      Patients Stated Pain Goal: 0 (33/74/45 1460)  Complications: No apparent anesthesia complications

## 2017-07-30 NOTE — Brief Op Note (Signed)
07/30/2017  3:57 PM  PATIENT:  Gerald Stabs  62 y.o. 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   Delay start of Pharmacological VTE agent (>24hrs) due to surgical blood loss or risk of bleeding: not applicable

## 2017-07-30 NOTE — Discharge Instructions (Addendum)
Follow Up with Dr. Posey Pronto Saturday 5/4 at 7:45am.    SLEEP UPRIGHT IF POSSIBLE, DO NOT SLEEP ON BACK UNTIL GAS BUBBLE GONE

## 2017-07-30 NOTE — Anesthesia Postprocedure Evaluation (Signed)
Anesthesia Post Note  Patient: Taylan Marez  Procedure(s) Performed: AIR/FLUID EXCHANGE (Right Eye)     Patient location during evaluation: PACU Anesthesia Type: MAC Level of consciousness: oriented, awake and alert and patient cooperative Pain management: pain level controlled Vital Signs Assessment: post-procedure vital signs reviewed and stable Respiratory status: spontaneous breathing, nonlabored ventilation and respiratory function stable Cardiovascular status: blood pressure returned to baseline and stable Postop Assessment: no apparent nausea or vomiting Anesthetic complications: no    Last Vitals:  Vitals:   07/30/17 1552 07/30/17 1600  BP: 112/74 116/67  Pulse: 61 60  Resp: 20 16  Temp:  36.6 C  SpO2: 96% 94%    Last Pain:  Vitals:   07/30/17 1600  TempSrc:   PainSc: 0-No pain                 Baleria Wyman,E. Nazia Rhines

## 2017-07-30 NOTE — Interval H&P Note (Signed)
History and Physical Interval Note:  07/30/2017 1:18 PM  Thomas Walsh  has presented today for surgery, with the diagnosis of ocular hypertension and retained gas in right eye with pupillary block.  The various methods of treatment have been discussed with the patient and family. After consideration of risks, benefits and other options for treatment, the patient has consented to  Procedure(s): AIR/FLUID EXCHANGE (Right) as a surgical intervention .  The patient's history has been reviewed, patient examined, no change in status, stable for surgery.  I have reviewed the patient's chart and labs.  Questions were answered to the patient's satisfaction.     [name]

## 2017-07-30 NOTE — Anesthesia Preprocedure Evaluation (Addendum)
Anesthesia Evaluation  Patient identified by MRN, date of birth, ID band Patient awake    Reviewed: Allergy & Precautions, NPO status , Patient's Chart, lab work & pertinent test results, reviewed documented beta blocker date and time   History of Anesthesia Complications Negative for: history of anesthetic complications  Airway Mallampati: II  TM Distance: >3 FB Neck ROM: Full    Dental  (+) Dental Advisory Given   Pulmonary sleep apnea (Bi PAP) ,    breath sounds clear to auscultation       Cardiovascular hypertension, Pt. on medications and Pt. on home beta blockers (-) angina+ CAD ('18 cath: No obstructive CAD. Patent stent in the mid LAD) and + Cardiac Stents  + dysrhythmias Atrial Fibrillation + pacemaker  Rhythm:Regular Rate:Normal  '16 ECHO: EF 55-60%, valves OK   Neuro/Psych negative neurological ROS     GI/Hepatic Neg liver ROS, GERD  Medicated and Poorly Controlled,  Endo/Other  Hypothyroidism Morbid obesity  Renal/GU negative Renal ROS     Musculoskeletal   Abdominal (+) + obese,   Peds  Hematology negative hematology ROS (+)   Anesthesia Other Findings   Reproductive/Obstetrics                            Anesthesia Physical Anesthesia Plan  ASA: III  Anesthesia Plan: MAC   Post-op Pain Management:    Induction:   PONV Risk Score and Plan: 1 and Ondansetron  Airway Management Planned: Natural Airway and Simple Face Mask  Additional Equipment:   Intra-op Plan:   Post-operative Plan:   Informed Consent: I have reviewed the patients History and Physical, chart, labs and discussed the procedure including the risks, benefits and alternatives for the proposed anesthesia with the patient or authorized representative who has indicated his/her understanding and acceptance.   Dental advisory given  Plan Discussed with: CRNA and Surgeon  Anesthesia Plan Comments: (Plan  routine monitors, MAC)        Anesthesia Quick Evaluation

## 2017-07-30 NOTE — Op Note (Signed)
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  A lid speculum was placed. Subconjunctival injection of 2% lidocaine was administered inferotemporally and inferonasally after topical tetracaine was instilled.   Filtered BSS was placed in the eye through a 30 gauge needle 3.5 mm posterior to the limbus at 7 o'clock while gas was passively removed through a 30 gauge needle 3.5 mm posterior to the limbus at 4 o'clock.    A limited exam under anesthesia was performed.  The retina was flat with good peripheral laser scars.  A small 5% gas bubble remained.  Topical lidocaine was applied over the injection sites.  The speculum and drapes were removed and the eye was patched with Polymixin/Bacitracin ophthalmic ointment. The patient was transferred alert and conversant with stable vital signs to the post operative recovery area.  The patient tolerated the procedure well and no complications were noted.  [name] MD

## 2017-07-31 ENCOUNTER — Encounter (HOSPITAL_COMMUNITY): Payer: Self-pay | Admitting: Ophthalmology

## 2017-08-04 ENCOUNTER — Encounter: Payer: Self-pay | Admitting: Internal Medicine

## 2017-08-04 ENCOUNTER — Ambulatory Visit (INDEPENDENT_AMBULATORY_CARE_PROVIDER_SITE_OTHER): Payer: BLUE CROSS/BLUE SHIELD | Admitting: Internal Medicine

## 2017-08-04 VITALS — BP 99/61 | HR 75 | Ht 70.0 in | Wt 248.8 lb

## 2017-08-04 DIAGNOSIS — I10 Essential (primary) hypertension: Secondary | ICD-10-CM

## 2017-08-04 DIAGNOSIS — I471 Supraventricular tachycardia: Secondary | ICD-10-CM | POA: Diagnosis not present

## 2017-08-04 DIAGNOSIS — I2510 Atherosclerotic heart disease of native coronary artery without angina pectoris: Secondary | ICD-10-CM | POA: Diagnosis not present

## 2017-08-04 DIAGNOSIS — Z9861 Coronary angioplasty status: Secondary | ICD-10-CM | POA: Diagnosis not present

## 2017-08-04 DIAGNOSIS — Z950 Presence of cardiac pacemaker: Secondary | ICD-10-CM

## 2017-08-04 DIAGNOSIS — I2583 Coronary atherosclerosis due to lipid rich plaque: Secondary | ICD-10-CM | POA: Diagnosis not present

## 2017-08-04 NOTE — Patient Instructions (Signed)
Your physician wants you to follow-up in: 6 months with Dr. Hilty. You will receive a reminder letter in the mail two months in advance. If you don't receive a letter, please call our office to schedule the follow-up appointment.    

## 2017-08-04 NOTE — Progress Notes (Signed)
OFFICE NOTE  Chief Complaint:  Improved chest discomfort  Primary Care Physician: Druscilla Brownie, PA-C  HPI:  Thomas Walsh is a 62 y.o. male who was referred from another patient of mine who is his brother-in-law. His primary physician is [name], MD and he sees Dr. Marcello Moores of [facility] in [place], [place]. [name] has a very complex cardiac history. He was noted to have had a left heart catheterization at [facility] in 2000 for profound symptomatic bradycardia with heart rate of 30s related to timolol eyedrops, but was found to have no obstructive coronary disease at the time. There is a family history of premature coronary artery disease Apparently in late 2015 he saw Dr. Marcello Moores in the office because of chest discomfort. He had described that as "poking, pressure and squeezing which awoke him from sleep". He was scheduled to have a stress test however during the EKG portion of the stress test there were changes suggestive of ischemia and the test was discontinued. Interpretation of that nuclear stress test was "myocardial perfusion imaging is positive for inducible ischemia. There are regions of reversible ischemia located in the inferior wall of the heart. Overall left ventricular systolic function is normal without regional wall motion abnormalities noted above." He was then going to be scheduled for elective heart catheterization however he presented to the emergency department due to continued chest pain. He underwent left heart catheterization on 03/07/2014. 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 After the procedure however he continued to have chest pain symptoms. He was placed on aspirin and Effient and started on low-dose calcium channel  blocker which caused low blood pressure and was discontinued. Cardiac markers were noted to be mildly elevated the next day at 0.41 troponin which was previously negative on 03/06/2014. LDL at that time was 52. He was then admitted to [facility] in [place], [place] for chest pain in January 2016. He also complained of palpitations and was found to have an ectopic atrial rhythm with normal ventricular response. He claims that he had had significant tachycardia and was thought to have had an SVT. He reports he subsequently presented and required treatment for an SVT on another occasion when he presented to the hospital around [place], [place]. He said that he received adenosine 6 mg and then 12 mg and ultimately converted to sinus. He then was reportedly referred to Dr. Nyra Market in [facility] who scheduled him for ablation. Apparently afterwards he reports that the ablation was not totally successful however a pacemaker was required to be placed - this leads me to believe that there may have been AV nodal damage at the time. 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 The pacemaker is by his report a Medtronic device and he does have a home monitoring. He said most recently he was 94% paced. He has had progressive antianginal regimens including Ranexa and topical nitrate. 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 He underwent his last cardiac catheterization in September 2017 which demonstrated only 30% stenosis at the distal edge of the mid LAD stent. FFR was performed which was 0.89. Apparently he was continuing to have recurrent arrhythmias. Mr. College also has had an opinion from a cardiologist  at [facility]. I cannot find records in care everywhere to support this however he does have some paperwork indicating he had an appointment. He does not feel that that appointment was helpful. He did bring a CD with images of his echo  and cath which I will review. His main complaint today is continued chest pain.   04/03/2016  [name] returns today for follow-up. I was able to review his extensive records and understand he is history including placement of a Medtronic advise a MRI safe pacemaker on 10/18/2014 for sick sinus syndrome. Subsequently he developed some PAF but reportedly has a very low burden of this. 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 Recently he saw Dr. Lovena Le and was not felt to be in heart block or to be pacer dependent. Because of his dyspnea he was scheduled for a cardiopulmonary exercise test. Mr. Schnitzer did undergo that study which indicated no significant circulatory limitation to exercise. His peak V02 was 103%. Pulmonary function was within normal limits. It was felt that likely his symptoms were related to obesity. These are preliminary results pending a cardiology over read, but I agree with those findings. He says that he said much less angina since adjusting his medications. He is now on imdur 60 mg daily as compared to his nitroglycerin patch. Although he has some mild headaches he says that he has not needed any short acting nitroglycerin in the last 3 weeks. He is also on an increased dose of metoprolol, namely 100 mg daily. He is generally pleased with his improvement in symptoms.  10/05/2016  Mr. Train was seen today in follow-up. He reports that over the past several weeks she's had more chest pain. He describes this as sharp and knifelike in the anterior left chest. It's not necessarily worse with exertion, in fact it's worse at night when he lays down after going to sleep. He's been more fatigued and gets short of breath somewhat easier. He had a stress test recently which was nonischemic (this was a cardiopulmonary exercise test, but was noted to be some maximal. It was felt that his limitation exercise was  due to obesity. He reports responsiveness to nitrates recently and says that he ran out of his isosorbide which caused him more chest pain but that it improved when he restarted it, however despite that he continues to have pain and requires short acting nitroglycerin for some relief. He says that some of his symptoms feel like his angina in the past. He's been established in our pacemaker clinic and had a check in December with Dr. Lovena Le however wishes to switch his follow-up to [facility].  11/18/2016  D[place]ding was seen today in follow-up. Over the past month he had recurrent anginal symptoms despite increasing his isosorbide and wish to have heart catheterization. He underwent left heart catheterization by Dr. Martinique on 10/20/2016 which showed a 20% mid-LAD stenosis normal LV systolic function, normal LVEDP and a patent stent. Despite this he continued to have chest discomfort and was started on low-dose amlodipine 2.5 mg. Initially note some relief in this and does report that his blood pressure is much better controlled however he still having symptoms. He said yesterday he felt horrible with a symptoms including dizziness, nausea, imbalance, a posterior neck base headache and pain that radiated down the left arm. He's also had some gait difficulty and reports recently having some word finding  difficulty. This concerns me of about a possible neurologic cause of his symptoms and perhaps is pain is a neuropathic or chest wall pain. At this point it's unlikely this is anginal pain.  02/16/2017  Mr. Liddy returns today for follow-up.  He reports he is done well over the past several months.  He denies any new anginal symptoms.  He is not needed any short acting nitroglycerin, but is in need of a refill.  Blood pressure is well controlled today.  He denies any palpitations.  He did follow-up with the neurologist Dr. Delice Lesch, who recommended neurocognitive testing and an MRI.  Unfortunately has not been scheduled.   I have asked them to reach out to neurology to see why this is been delayed.  He has a follow-up with her in March.  He is also scheduled to have a pacemaker check with Dr. Loletha Grayer on December 12.  08/04/2017  Mr. Nelis returns today for follow-up.  He was seen in March by Jory Sims, DNP -he had reported some chest discomfort was noted to be hypertensive.  She recommended adjustments on his medications including increasing amlodipine to 5 mg.  He reports improvement of his blood pressure and symptoms without adjustment.  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  He does seem to have had salvage of his eyesight in the right eye.  PMHx:  Past Medical History:  Diagnosis Date  . Asthma   . 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 Coronary artery disease involving native coronary artery of native heart with unstable angina pectoris ([place]) 02/28/2016  . Essential hypertension 02/28/2016  . GERD (gastroesophageal reflux disease)   . HLD (hyperlipidemia)   . Intraocular pressure increase, right   . 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 OSA treated with BiPAP   . PAF (paroxysmal atrial fibrillation) ([place]) 02/28/2016  . PCO (posterior capsular opacification)   . Presence of permanent cardiac pacemaker   . Pseudophakia of both eyes   . S/P bilateral cataract extraction 11/21/2013  . S/P placement of cardiac pacemaker 02/28/2016   Medtronic  . S/P primary angioplasty with coronary stent 02/28/2016  . Secondary corneal edema of left eye   . Status post corneal transplant   . Thyroid disease     Past Surgical History:  Procedure Laterality Date  . AIR/FLUID EXCHANGE Right 07/30/2017   Procedure: AIR/FLUID EXCHANGE;  Surgeon: Jalene Mullet, MD;  Location: [facility];  Service: Ophthalmology;  Laterality: Right;  . APPENDECTOMY    . AQUEOUS SHUNT EXTRAOCULAR Left 09/18/2013   Elenore Rota L. Pennie Banter, MD [facility]  . CARDIAC CATHETERIZATION  03/07/2014  . CATARACT  EXTRACTION Bilateral   . CATARACT EXTRACTION W/ INTRAOCULAR LENS IMPLANT Left 01/2012   McCu[place], MD  . CATARACT EXTRACTION W/ INTRAOCULAR LENS IMPLANT Right 2003   [facility]  . CORNEAL TRANSPLANT Left 01/17/2015   Keratoplasty, Endothelial. Nelma Rothman, MD [place]   . DESCEMETS STRIPPING AUTOMATED ENDOTHELIAL KERATOPLASTY Left 04/2012   Marilynne Halsted, MD;  . GLAUCOMA SURGERY Left 09/18/2013   [place] BG-101 implant with cornea graft  . LEFT HEART CATH AND CORONARY ANGIOGRAPHY N/A 10/20/2016   Procedure: Left Heart Cath and Coronary Angiography;  Surgeon: Martinique, Peter M, MD;  Location: [facility];  Service: Cardiovascular;  Laterality: N/A;  . PACEMAKER INSERTION  09/2014   Done at [facility].   Marland Kitchen PARS PLANA VITRECTOMY Right 07/14/2017   Procedure: RETINAL DETACHMENT REPAIR RIGHT EYE TWENTY-FIVE GAUGE  VITRECTOMY WITH ENDOLASER AND GAS;  Surgeon: Jalene Mullet, MD;  Location: [facility];  Service: Ophthalmology;  Laterality: Right;  . SCLERAL REINFORCEMENT Left 09/18/2013   with graft, Edson Snowball, MD, [facility]  . WISDOM TOOTH EXTRACTION      FAMHx:  Family History  Problem Relation Age of Onset  . Colon cancer Mother   . Hypertension Mother   . Kidney disease Father   . CAD Father   . Stroke Father   . Spina bifida Sister   . Thyroid disease Sister   . Diabetes Brother   . Retinal detachment Brother     SOCHx:   reports that he has never smoked. He has never used smokeless tobacco. He reports that he drinks about 2.4 oz of alcohol per week. He reports that he does not use drugs.  ALLERGIES:  Allergies  Allergen Reactions  . Beta Adrenergic Blockers Anaphylaxis and Other (See Comments)    Timolol caused the patient's heart to STOP  . Other Other (See Comments)    Beta Blockers-Timolol caused black outs and the patient's heart stopped!!  . Timolol Anaphylaxis and Other (See Comments)    Cardiac stand still/stops heart  Pt reports hx severe  bradycardia after long term use of timolol Cardiac stand still/Stops heart     ROS: Pertinent items noted in HPI and remainder of comprehensive ROS otherwise negative.  HOME MEDS: Current Outpatient Medications on File Prior to Visit  Medication Sig Dispense Refill  . acetaZOLAMIDE (DIAMOX) 500 MG capsule Take 1 capsule by mouth 2 (two) times daily.  0  . amLODipine (NORVASC) 5 MG tablet Take 5 mg every morning (Patient taking differently: Take 5 mg by mouth every morning. ) 30 tablet 3  . aspirin 325 MG tablet Take 325 mg by mouth daily.    Marland Kitchen atorvastatin (LIPITOR) 40 MG tablet Take 1 tablet (40 mg total) by mouth daily. (Patient taking differently: Take 40 mg by mouth at bedtime. ) 90 tablet 1  . AZOPT 1 % ophthalmic suspension Place 1 drop into the right eye 4 (four) times daily.  1  . Brinzolamide-Brimonidine (SIMBRINZA) 1-0.2 % SUSP Place 1 drop into the right eye 2 (two) times daily.     . cloNIDine (CATAPRES) 0.1 MG tablet Take 0.1 mg by mouth See admin instructions. Take 0.1 mg by mouth if systolic b/p reading is 527 or greater  2  . ezetimibe (ZETIA) 10 MG tablet Take 10 mg by mouth at bedtime.     . fluticasone (FLONASE) 50 MCG/ACT nasal spray Place 1 spray into both nostrils daily as needed (for seasonal allergies).     . isosorbide mononitrate (IMDUR) 60 MG 24 hr tablet Take 1 tablet (60mg ) by mouth in the morning and 1/2 tablet (30mg ) in the evening. (Patient taking differently: Take 30-60 mg by mouth See admin instructions. Take 1 tablet (60mg ) by mouth in the morning and 1/2 tablet (30mg ) in the evening) 135 tablet 3  . levothyroxine (SYNTHROID, LEVOTHROID) 100 MCG tablet Take 100 mcg by mouth daily before breakfast.    . liraglutide (VICTOZA) 18 MG/3ML SOPN Inject 0.3 mLs (1.8 mg total) into the skin daily.    . metoprolol succinate (TOPROL-XL) 50 MG 24 hr tablet TAKE 1 TABLET BY MOUTH TWICE DAILY 60 tablet 4  . nitroGLYCERIN (NITROSTAT) 0.4 MG SL tablet Place 1 tablet (0.4 mg  total) every 5 (five) minutes as needed under the tongue for chest pain. MAX 3 doses 25 tablet 3  .  ofloxacin (OCUFLOX) 0.3 % ophthalmic solution Place 1 drop into the right eye 4 (four) times daily.  0  . omeprazole (PRILOSEC) 40 MG capsule TAKE 1 CAPSULE BY MOUTH TWICE DAILY 180 capsule 3  . pilocarpine (PILOCAR) 2 % ophthalmic solution Place 1 drop into the right eye 4 (four) times daily.    . pilocarpine (PILOCAR) 2 % ophthalmic solution Place 1 drop into the right eye 4 (four) times daily.    . prasugrel (EFFIENT) 10 MG TABS tablet Take 1 tablet (10 mg total) by mouth daily. 90 tablet 2  . prednisoLONE acetate (PRED FORTE) 1 % ophthalmic suspension Place 1 drop into the left eye 3 (three) times daily.    Marland Kitchen PRESCRIPTION MEDICATION BiPAP: At bedtime and during naps    . RANEXA 500 MG 12 hr tablet TAKE 2 TABLETS BY MOUTH TWICE DAILY 360 tablet 1  . tadalafil (CIALIS) 5 MG tablet Take 5 mg by mouth as needed for erectile dysfunction.    Marland Kitchen testosterone cypionate (DEPO-TESTOSTERONE) 200 MG/ML injection Inject 200 mg into the muscle every 14 (fourteen) days.     No current facility-administered medications on file prior to visit.     LABS/IMAGING: No results found for this or any previous visit (from the past 48 hour(s)). No results found.  WEIGHTS: Wt Readings from Last 3 Encounters:  08/04/17 248 lb 12.8 oz (112.9 kg)  07/30/17 250 lb (113.4 kg)  07/14/17 250 lb (113.4 kg)    VITALS: BP 99/61   Pulse 75   Ht 5\' 10"  (1.778 m)   Wt 248 lb 12.8 oz (112.9 kg)   SpO2 97%   BMI 35.70 kg/m   EXAM: General appearance: alert and no distress Neck: no carotid bruit, no JVD and thyroid not enlarged, symmetric, no tenderness/mass/nodules Lungs: clear to auscultation bilaterally Heart: regular rate and rhythm Abdomen: soft, non-tender; bowel sounds normal; no masses,  no organomegaly and obese Extremities: extremities normal, atraumatic, no cyanosis or edema Pulses: 2+ and  symmetric Skin: Skin color, texture, turgor normal. No rashes or lesions Neurologic: Grossly normal Psych: Pleasant  EKG: Deferred  ASSESSMENT: 1. Chronic chest pain, dizziness, headache, word finding difficulty, left arm pain 2. Coronary artery disease with persistent angina 3. Myocardial bridge status post PCI in 02/2014 with a Promus Premier DES 4. History of SVT with ablation and need for subsequent PPM 5. S/p PPM (Medtronic) 6. Dyslipidemia 7. Hypertension 8. OSA on BIPAP  PLAN: 1.   Mr. Athey had improvement in his chest discomfort with adjustment his medications.  Blood pressure is very well controlled.  He denies any worsening chest pain or shortness of breath.  He is mostly been dealing with retinal detachment.  Hopefully that will continue to settle down and his vision will be preserved.  We will continue medical therapy.  His pacemaker has been recently remotely interrogated and is working properly with adequate battery life.  Follow-up with me in 6 months or sooner as necessary.   Pixie Casino, MD, [facility] [place] Director of the Advanced Lipid Disorders &  Cardiovascular Risk Reduction Clinic Attending Cardiologist  Direct Dial: [phone]  Fax: [phone]  Website:  [email or web address] 08/04/2017, 9:13 AM

## 2017-08-08 ENCOUNTER — Other Ambulatory Visit: Payer: Self-pay | Admitting: Internal Medicine

## 2017-08-08 DIAGNOSIS — I2510 Atherosclerotic heart disease of native coronary artery without angina pectoris: Secondary | ICD-10-CM

## 2017-08-08 DIAGNOSIS — Z9861 Coronary angioplasty status: Principal | ICD-10-CM

## 2017-08-11 ENCOUNTER — Telehealth: Payer: Self-pay | Admitting: Internal Medicine

## 2017-08-11 DIAGNOSIS — I2510 Atherosclerotic heart disease of native coronary artery without angina pectoris: Secondary | ICD-10-CM

## 2017-08-11 DIAGNOSIS — Z9861 Coronary angioplasty status: Principal | ICD-10-CM

## 2017-08-11 MED ORDER — METOPROLOL SUCCINATE ER 50 MG PO TB24: 50.0000 mg | ORAL_TABLET | Freq: Two times a day (BID) | ORAL | 1 refills | Status: DC

## 2017-08-11 MED ORDER — NITROGLYCERIN 0.4 MG SL SUBL: 0.4000 mg | SUBLINGUAL_TABLET | SUBLINGUAL | 1 refills | Status: DC | PRN

## 2017-08-11 NOTE — Telephone Encounter (Signed)
New message     *STAT* If patient is at the pharmacy, call can be transferred to refill team.   1. Which medications need to be refilled? (please list name of each medication and dose if known) nitroGLYCERIN (NITROSTAT) 0.4 MG SL tablet,  metoprolol succinate (TOPROL-XL) 50 MG 24 hr tablet 2. Which pharmacy/location (including street and city if local pharmacy) is medication to be sent to? [place], [address]  3. Do they need a 30 day or 90 day supply? [place]

## 2017-08-12 ENCOUNTER — Telehealth: Payer: Self-pay | Admitting: Internal Medicine

## 2017-08-12 NOTE — Telephone Encounter (Signed)
Spoke to pharmacist  [place] PATIENT HAS  PRESCRIPTION  FOR CIALIS  PRN FROM PRIMARY, BUT ON PATIENT'S  MEDICATION LIST - IMDUR 60 MG  DAILY , A NTG ON PRN BASIS.  PHARMACIST  STATES CONTRAINDICATION TAKE BOTH SHE WANTED TO KNOW IF DR HILTY WAS AWARE .  WHAT DIRECTION TO GIVE PATIENT ??  AWARE  WILL DEFER  DR HILTY - OUT OF OFFICE AT PRESENT?

## 2017-08-12 NOTE — Telephone Encounter (Signed)
RN spoke to pharmacist Lenna Sciara- informed her patient  cannot take Cialis with Imdur OR PRN NITRO. PHARMACIST STATES SHE WILL CALL PATIENT'S PRIMARY IN  [place] - WHO PRESCRIBED CIALIS

## 2017-08-12 NOTE — Telephone Encounter (Signed)
Melissa/[facility] calling   Has question about drug interaction. Please advise

## 2017-08-12 NOTE — Telephone Encounter (Signed)
I was not aware - he cannot take cialis with imdur or PRN nitro.  DR. Lemmie Evens

## 2017-08-16 ENCOUNTER — Encounter: Payer: Self-pay | Admitting: Internal Medicine

## 2017-08-16 ENCOUNTER — Ambulatory Visit: Payer: BLUE CROSS/BLUE SHIELD | Admitting: Internal Medicine

## 2017-09-21 ENCOUNTER — Encounter (HOSPITAL_COMMUNITY): Payer: Self-pay | Admitting: Ophthalmology

## 2017-10-04 ENCOUNTER — Ambulatory Visit (INDEPENDENT_AMBULATORY_CARE_PROVIDER_SITE_OTHER): Payer: BLUE CROSS/BLUE SHIELD | Admitting: *Deleted

## 2017-10-04 ENCOUNTER — Telehealth: Payer: Self-pay | Admitting: Cardiology

## 2017-10-04 DIAGNOSIS — I495 Sick sinus syndrome: Secondary | ICD-10-CM

## 2017-10-04 NOTE — Telephone Encounter (Signed)
LMOVM reminding pt to send remote transmission.   

## 2017-10-05 NOTE — Progress Notes (Signed)
Remote pacemaker transmission.   

## 2017-10-08 LAB — CUP PACEART REMOTE DEVICE CHECK
Battery Remaining Longevity: 95 mo
Battery Voltage: 3.02 V
Brady Statistic AP VS Percent: 27.59 %
Brady Statistic AS VS Percent: 72.36 %
Brady Statistic RV Percent Paced: 0.05 %
Implantable Lead Implant Date: 20160721
Implantable Lead Location: 753859
Implantable Lead Model: 5076
Lead Channel Impedance Value: 399 Ohm
Lead Channel Impedance Value: 513 Ohm
Lead Channel Impedance Value: 570 Ohm
Lead Channel Pacing Threshold Amplitude: 0.625 V
Lead Channel Pacing Threshold Amplitude: 0.75 V
Lead Channel Pacing Threshold Pulse Width: 0.4 ms
Lead Channel Sensing Intrinsic Amplitude: 6.125 mV
Lead Channel Sensing Intrinsic Amplitude: 6.875 mV
Lead Channel Sensing Intrinsic Amplitude: 6.875 mV
Lead Channel Setting Pacing Amplitude: 2 V
Lead Channel Setting Sensing Sensitivity: 0.9 mV
MDC IDC LEAD IMPLANT DT: 20160721
MDC IDC LEAD LOCATION: 753860
MDC IDC MSMT LEADCHNL RA PACING THRESHOLD PULSEWIDTH: 0.4 ms
MDC IDC MSMT LEADCHNL RV IMPEDANCE VALUE: 456 Ohm
MDC IDC MSMT LEADCHNL RV SENSING INTR AMPL: 6.125 mV
MDC IDC PG IMPLANT DT: 20160721
MDC IDC SESS DTM: 20190709164313
MDC IDC SET LEADCHNL RA PACING AMPLITUDE: 1.5 V
MDC IDC SET LEADCHNL RV PACING PULSEWIDTH: 0.4 ms
MDC IDC STAT BRADY AP VP PERCENT: 0.03 %
MDC IDC STAT BRADY AS VP PERCENT: 0.02 %
MDC IDC STAT BRADY RA PERCENT PACED: 27.59 %

## 2017-11-15 DIAGNOSIS — R109 Unspecified abdominal pain: Secondary | ICD-10-CM | POA: Insufficient documentation

## 2017-11-16 ENCOUNTER — Other Ambulatory Visit: Payer: Self-pay | Admitting: Internal Medicine

## 2017-11-16 DIAGNOSIS — I25119 Atherosclerotic heart disease of native coronary artery with unspecified angina pectoris: Secondary | ICD-10-CM

## 2017-11-17 NOTE — Telephone Encounter (Signed)
Rx sent to pharmacy   

## 2017-11-25 ENCOUNTER — Telehealth: Payer: Self-pay | Admitting: Internal Medicine

## 2017-11-25 NOTE — Telephone Encounter (Signed)
Returned pt [email or web address] 

## 2017-11-25 NOTE — Telephone Encounter (Signed)
New Message:   Pt c/o BP issue: STAT if pt c/o blurred vision, one-sided weakness or slurred speech  1. What are your last 5 BP readings? 198/98   2. Are you having any other symptoms (ex. Dizziness, headache, blurred vision, passed out)? Chest pain, Blurred Vision  3. What is your BP issue? High BP

## 2017-11-25 NOTE — Telephone Encounter (Signed)
Spoke with pt's wife [place]  She sts that the pt was seen at their local ED [facility] in [place], [place]. For a BP of 198/98, pt was also having blurred vision She is unable to report all the testing that was completed but she is sure that he had a CT and was given Nitro and Dilaudid.  Asked if a Troponin series and EKG was completed. Pt's wife is unsure. It seems as they were more concerned about getting his BP down and she doesn't think he got a cardiac work up. [place] sts that [employer] [employer] is a [facility] center and doesn't have a Merchant [employer] officer.  They use to live in [place] and still travel to this area (3 hour drive) to see their physicians. They will be in town on 12/01/17 to have testing order by another physician on 9/4 and would like to be seen by Dr. Debara Pickett that same day. Adv her that Dr.Hilty is out of the office. I was able to scheduled an appt with Kerin Ransom, [place] for 9/4 @ [facility] that the pt is to return to the ED or call 911 for any reoccurrence of symptoms. She agreed and verbalized understanding. She is aware of the appt and voiced appreciation for the assistance.

## 2017-12-01 ENCOUNTER — Encounter: Payer: Self-pay | Admitting: Cardiology

## 2017-12-01 ENCOUNTER — Ambulatory Visit (INDEPENDENT_AMBULATORY_CARE_PROVIDER_SITE_OTHER): Payer: BLUE CROSS/BLUE SHIELD | Admitting: Cardiology

## 2017-12-01 VITALS — BP 132/81 | HR 71 | Ht 70.0 in | Wt 265.4 lb

## 2017-12-01 DIAGNOSIS — Z950 Presence of cardiac pacemaker: Secondary | ICD-10-CM

## 2017-12-01 DIAGNOSIS — I471 Supraventricular tachycardia: Secondary | ICD-10-CM | POA: Diagnosis not present

## 2017-12-01 DIAGNOSIS — Z9861 Coronary angioplasty status: Secondary | ICD-10-CM

## 2017-12-01 DIAGNOSIS — Z8679 Personal history of other diseases of the circulatory system: Secondary | ICD-10-CM | POA: Diagnosis not present

## 2017-12-01 DIAGNOSIS — Z9989 Dependence on other enabling machines and devices: Secondary | ICD-10-CM

## 2017-12-01 DIAGNOSIS — E785 Hyperlipidemia, unspecified: Secondary | ICD-10-CM

## 2017-12-01 DIAGNOSIS — R079 Chest pain, unspecified: Secondary | ICD-10-CM

## 2017-12-01 DIAGNOSIS — I2510 Atherosclerotic heart disease of native coronary artery without angina pectoris: Secondary | ICD-10-CM | POA: Diagnosis not present

## 2017-12-01 DIAGNOSIS — R0609 Other forms of dyspnea: Secondary | ICD-10-CM | POA: Diagnosis not present

## 2017-12-01 DIAGNOSIS — G8929 Other chronic pain: Secondary | ICD-10-CM

## 2017-12-01 DIAGNOSIS — G4733 Obstructive sleep apnea (adult) (pediatric): Secondary | ICD-10-CM

## 2017-12-01 MED ORDER — ASPIRIN EC 81 MG PO TBEC: 81.0000 mg | DELAYED_RELEASE_TABLET | Freq: Every day | ORAL | 3 refills | Status: AC

## 2017-12-01 NOTE — Assessment & Plan Note (Signed)
C-pap compliant 

## 2017-12-01 NOTE — Assessment & Plan Note (Signed)
On two drug therapy-followed by PCP

## 2017-12-01 NOTE — Assessment & Plan Note (Signed)
Recently seen in an outside facility for uncontrolled HTN. No medication changes made

## 2017-12-01 NOTE — Patient Instructions (Addendum)
Medication Instructions:  Decrease Aspirin to 81 mg daily  Testing/Procedures: Your physician has requested that you have an echocardiogram. Echocardiography is a painless test that uses sound waves to create images of your heart. It provides your doctor with information about the size and shape of your heart and how well your heart's chambers and valves are working. This procedure takes approximately one hour. There are no restrictions for this procedure.  This will be done at our [facility] location:  [address]  Follow-Up: Dr. Debara Pickett after echo    If you need a refill on your cardiac medications before your next appointment, please call your pharmacy.

## 2017-12-01 NOTE — Assessment & Plan Note (Signed)
Medtronic

## 2017-12-01 NOTE — Progress Notes (Signed)
12/01/2017 Gerald Stabs   04/11/1955  [id number]  Primary Physician Druscilla Brownie, PA-C Primary Cardiologist: Dr Debara Pickett  HPI:  62 y/o male followed by Dr Debara Pickett. He had previously been followed by a cardiologist near the [facility]. He has a long complicated cardiac history but to summarize the patient has CAD and had an LAD PCI Dec 2015-treated with Promus DES. 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 In March 2016 he reportedly had ISR of the LAD site treated with PCI at [facility]. His last cath was Jan 2018 and showed no ISR of the LAD and otherwise patent coronaries. He has had problems with chronic chest pain and dyspnea. He had a cardiopulmonary stress test in Jan 2018 that was essentially negative. His last OV with Dr Debara Pickett was May 2019 and he was doing well.   He is in the office today to be seen after he had an episode of accelerated HTN and went to a facility in [place]. The pt says he can tell when his B/P is up, he feels :flushed". He took his B/P and it was 240 systolic. He took his PRN Clonidine but his B/P went higher- 190. He went to the small hospital in [place] but declined admission. I have no records from that hospitalization. His medications were not changed. In the office today his B/P is 128/78 by me. He does say he has noticed more DOE and intermittent Lt sided chest pain recently.     Current Outpatient Medications  Medication Sig Dispense Refill  . levothyroxine (SYNTHROID, LEVOTHROID) 125 MCG tablet Take 1 tablet by mouth daily.    Marland Kitchen acetaZOLAMIDE (DIAMOX) 500 MG capsule Take 1 capsule by mouth 2 (two) times daily.  0  . amLODipine (NORVASC) 5 MG tablet Take 5 mg every morning (Patient taking differently: Take 5 mg by mouth every morning. ) 30 tablet 3  . aspirin EC 81 MG tablet Take 1 tablet (81 mg total) by mouth daily. 90 tablet 3  . atorvastatin (LIPITOR) 40 MG tablet Take 1 tablet (40 mg total) by mouth daily. (Patient taking differently: Take  40 mg by mouth at bedtime. ) 90 tablet 1  . AZOPT 1 % ophthalmic suspension Place 1 drop into the right eye 4 (four) times daily.  1  . Brinzolamide-Brimonidine (SIMBRINZA) 1-0.2 % SUSP Place 1 drop into the right eye 2 (two) times daily.     . cloNIDine (CATAPRES) 0.1 MG tablet Take 0.1 mg by mouth See admin instructions. Take 0.1 mg by mouth if systolic b/p reading is 973 or greater  2  . ezetimibe (ZETIA) 10 MG tablet Take 10 mg by mouth at bedtime.     . fluticasone (FLONASE) 50 MCG/ACT nasal spray Place 1 spray into both nostrils daily as needed (for seasonal allergies).     . isosorbide mononitrate (IMDUR) 60 MG 24 hr tablet TAKE 1 TABLET BY MOUTH ONCE DAILY IN THE MORNING AND 1/2 (ONE-HALF) ONCE DAILY IN THE EVENING 135 tablet 2  . liraglutide (VICTOZA) 18 MG/3ML SOPN Inject 0.3 mLs (1.8 mg total) into the skin daily.    . metoprolol succinate (TOPROL-XL) 50 MG 24 hr tablet Take 1 tablet (50 mg total) by mouth 2 (two) times daily. Take with or immediately following a meal. 180 tablet 1  . nitroGLYCERIN (NITROSTAT) 0.4 MG SL tablet Place 1 tablet (0.4 mg total) under the tongue every 5 (five) minutes as needed for chest pain. 75 tablet  1  . ofloxacin (OCUFLOX) 0.3 % ophthalmic solution Place 1 drop into the right eye 4 (four) times daily.  0  . omeprazole (PRILOSEC) 40 MG capsule TAKE 1 CAPSULE BY MOUTH TWICE DAILY 180 capsule 3  . pilocarpine (PILOCAR) 2 % ophthalmic solution Place 1 drop into the right eye 4 (four) times daily.    . prasugrel (EFFIENT) 10 MG TABS tablet Take 1 tablet (10 mg total) by mouth daily. 90 tablet 2  . prednisoLONE acetate (PRED FORTE) 1 % ophthalmic suspension Place 1 drop into the left eye 3 (three) times daily.    Marland Kitchen PRESCRIPTION MEDICATION BiPAP: At bedtime and during naps    . RANEXA 500 MG 12 hr tablet TAKE 2 TABLETS BY MOUTH TWICE DAILY 360 tablet 1  . testosterone cypionate (DEPO-TESTOSTERONE) 200 MG/ML injection Inject 200 mg into the muscle every 14  (fourteen) days.     No current facility-administered medications for this visit.     Allergies  Allergen Reactions  . Beta Adrenergic Blockers Anaphylaxis and Other (See Comments)    Timolol caused the patient's heart to STOP  . Other Other (See Comments)    Beta Blockers-Timolol caused black outs and the patient's heart stopped!!  . Timolol Anaphylaxis and Other (See Comments)    Cardiac stand still/stops heart  Pt reports hx severe bradycardia after long term use of timolol Cardiac stand still/Stops heart     Past Medical History:  Diagnosis Date  . Asthma   . 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 Coronary artery disease involving native coronary artery of native heart with unstable angina pectoris ([place]) 02/28/2016  . Essential hypertension 02/28/2016  . GERD (gastroesophageal reflux disease)   . HLD (hyperlipidemia)   . Intraocular pressure increase, right   . 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 OSA treated with BiPAP   . PAF (paroxysmal atrial fibrillation) ([place]) 02/28/2016  . PCO (posterior capsular opacification)   . Presence of permanent cardiac pacemaker   . Pseudophakia of both eyes   . S/P bilateral cataract extraction 11/21/2013  . S/P placement of cardiac pacemaker 02/28/2016   Medtronic  . S/P primary angioplasty with coronary stent 02/28/2016  . Secondary corneal edema of left eye   . Status post corneal transplant   . Thyroid disease     Social History   Socioeconomic History  . Marital status: Married    Spouse name: Not on file  . Number of children: Not on file  . Years of education: Not on file  . Highest education level: Not on file  Occupational History  . Occupation: Market researcher   Social Needs  . Financial resource strain: Not on file  . Food insecurity:    Worry: Not on file    Inability: Not on file  . Transportation needs:    Medical: Not on file    Non-medical: Not on file  Tobacco Use  . Smoking status: Never Smoker  .  Smokeless tobacco: Never Used  Substance and Sexual Activity  . Alcohol use: Yes    Alcohol/week: 4.0 standard drinks    Types: 4 Shots of liquor per week    Comment: weekly  . Drug use: No  . Sexual activity: Not on file  Lifestyle  . Physical activity:    Days per week: Not on file    Minutes per session: Not on file  . Stress: Not on file  Relationships  . Social connections:    Talks on phone:  Not on file    Gets together: Not on file    Attends religious service: Not on file    Active member of club or organization: Not on file    Attends meetings of clubs or organizations: Not on file    Relationship status: Not on file  . Intimate partner violence:    Fear of current or ex partner: Not on file    Emotionally abused: Not on file    Physically abused: Not on file    Forced sexual activity: Not on file  Other Topics Concern  . Not on file  Social History Narrative  . Not on file     Family History  Problem Relation Age of Onset  . Colon cancer Mother   . Hypertension Mother   . Kidney disease Father   . CAD Father   . Stroke Father   . Spina bifida Sister   . Thyroid disease Sister   . Diabetes Brother   . Retinal detachment Brother      Review of Systems: General: negative for chills, fever, night sweats or weight changes.  Pt had retinal detachment and two surgeries this Spring Cardiovascular: negative for edema, orthopnea, palpitations, paroxysmal nocturnal dyspnea Dermatological: negative for rash Respiratory: negative for cough or wheezing Urologic: negative for hematuria Abdominal: negative for nausea, vomiting, diarrhea, bright red blood per rectum, melena, or hematemesis Neurologic: negative for visual changes, syncope, or dizziness All other systems reviewed and are otherwise negative except as noted above.    Blood pressure 132/81, pulse 71, height 5\' 10"  (1.778 m), weight 265 lb 6.4 oz (120.4 kg).  General appearance: alert, cooperative, no  distress and moderately obese Neck: no carotid bruit and no JVD Lungs: clear to auscultation bilaterally Heart: regular rate and rhythm Extremities: no edema Skin: cool and dry Neurologic: Grossly normal  EKG NSR, NSST changes-no change  ASSESSMENT AND PLAN:   History of uncontrolled hypertension Recently seen in an outside facility for uncontrolled HTN. No medication changes made  CAD S/P percutaneous coronary angioplasty  02/2014: Critical mid LAD bridge noted, 90-99%; LCX patent, RCA patent, RPDA patent, sp PCI of myocardial bridge performed   05/2014 [facility] reading of cath: Insignificant CAD (RCA 20% only) with muscle-bridge in LAD.  Stent of LAD after IVUS of LAD   09/2016 Cath -no LAD ISR, no other CAD  S/P placement of cardiac pacemaker Medtronic  Dyslipidemia, goal LDL below 70 On two drug therapy-followed by PCP  OSA on CPAP C-pap compliant   PLAN  No change in medical Rx. Dr Debara Pickett is now the pt's cardiologist. I ordered an echo. F/U with Dr Debara Pickett after this.   Kerin Ransom PA-C 12/01/2017 4:29 PM

## 2017-12-01 NOTE — Assessment & Plan Note (Addendum)
02/2014: Critical mid LAD bridge noted, 90-99%; LCX patent, RCA patent, RPDA patent, sp PCI of myocardial bridge performed   05/2014 [facility] reading of cath: Insignificant CAD (RCA 20% only) with muscle-bridge in LAD.  Stent of LAD after IVUS of LAD   09/2016 Cath -no LAD ISR, no other CAD

## 2017-12-10 ENCOUNTER — Ambulatory Visit (HOSPITAL_COMMUNITY): Payer: BLUE CROSS/BLUE SHIELD | Attending: Cardiology

## 2017-12-10 ENCOUNTER — Other Ambulatory Visit: Payer: Self-pay

## 2017-12-10 DIAGNOSIS — R0609 Other forms of dyspnea: Secondary | ICD-10-CM | POA: Diagnosis present

## 2017-12-10 DIAGNOSIS — J45909 Unspecified asthma, uncomplicated: Secondary | ICD-10-CM | POA: Diagnosis not present

## 2017-12-10 DIAGNOSIS — I4891 Unspecified atrial fibrillation: Secondary | ICD-10-CM | POA: Insufficient documentation

## 2017-12-10 DIAGNOSIS — I371 Nonrheumatic pulmonary valve insufficiency: Secondary | ICD-10-CM | POA: Diagnosis not present

## 2017-12-10 DIAGNOSIS — Z950 Presence of cardiac pacemaker: Secondary | ICD-10-CM | POA: Diagnosis not present

## 2017-12-10 DIAGNOSIS — G4733 Obstructive sleep apnea (adult) (pediatric): Secondary | ICD-10-CM | POA: Insufficient documentation

## 2017-12-10 DIAGNOSIS — I2510 Atherosclerotic heart disease of native coronary artery without angina pectoris: Secondary | ICD-10-CM | POA: Insufficient documentation

## 2017-12-10 DIAGNOSIS — E785 Hyperlipidemia, unspecified: Secondary | ICD-10-CM | POA: Insufficient documentation

## 2017-12-10 DIAGNOSIS — I509 Heart failure, unspecified: Secondary | ICD-10-CM | POA: Diagnosis not present

## 2017-12-10 DIAGNOSIS — I110 Hypertensive heart disease with heart failure: Secondary | ICD-10-CM | POA: Diagnosis not present

## 2017-12-10 DIAGNOSIS — E079 Disorder of thyroid, unspecified: Secondary | ICD-10-CM | POA: Diagnosis not present

## 2017-12-17 ENCOUNTER — Telehealth: Payer: Self-pay

## 2017-12-17 NOTE — Telephone Encounter (Signed)
-----   Message from Ledora Bottcher, [place] sent at 12/17/2017  7:59 AM EDT ----- Your cardiac ultrasound is stable. Your heart is mildly stiffened, which happens with age. No changes in medications.

## 2018-01-03 ENCOUNTER — Ambulatory Visit (INDEPENDENT_AMBULATORY_CARE_PROVIDER_SITE_OTHER): Payer: BLUE CROSS/BLUE SHIELD | Admitting: Internal Medicine

## 2018-01-03 ENCOUNTER — Encounter: Payer: Self-pay | Admitting: Internal Medicine

## 2018-01-03 VITALS — BP 122/76 | HR 81 | Ht 70.0 in | Wt 268.6 lb

## 2018-01-03 DIAGNOSIS — I2510 Atherosclerotic heart disease of native coronary artery without angina pectoris: Secondary | ICD-10-CM | POA: Diagnosis not present

## 2018-01-03 DIAGNOSIS — G894 Chronic pain syndrome: Secondary | ICD-10-CM | POA: Insufficient documentation

## 2018-01-03 DIAGNOSIS — M4800 Spinal stenosis, site unspecified: Secondary | ICD-10-CM | POA: Insufficient documentation

## 2018-01-03 DIAGNOSIS — Z8679 Personal history of other diseases of the circulatory system: Secondary | ICD-10-CM | POA: Diagnosis not present

## 2018-01-03 DIAGNOSIS — I2583 Coronary atherosclerosis due to lipid rich plaque: Secondary | ICD-10-CM | POA: Diagnosis not present

## 2018-01-03 DIAGNOSIS — M47819 Spondylosis without myelopathy or radiculopathy, site unspecified: Secondary | ICD-10-CM | POA: Insufficient documentation

## 2018-01-03 DIAGNOSIS — G4733 Obstructive sleep apnea (adult) (pediatric): Secondary | ICD-10-CM | POA: Diagnosis not present

## 2018-01-03 DIAGNOSIS — M5416 Radiculopathy, lumbar region: Secondary | ICD-10-CM | POA: Insufficient documentation

## 2018-01-03 DIAGNOSIS — Z9989 Dependence on other enabling machines and devices: Secondary | ICD-10-CM

## 2018-01-03 MED ORDER — AMLODIPINE BESYLATE 10 MG PO TABS: 10.0000 mg | ORAL_TABLET | Freq: Every day | ORAL | 0 refills | Status: DC

## 2018-01-03 NOTE — Patient Instructions (Signed)
Medication Instructions:  INCREASE amlodipine to 10mg  daily (take 2 of your 10mg  tablets)  If you need a refill on your cardiac medications before your next appointment, please call your pharmacy.   Follow-Up: At [facility], you and your health needs are our priority.  As part of our continuing mission to provide you with exceptional heart care, we have created designated Provider Care Teams.  These Care Teams include your primary Cardiologist (physician) and Advanced Practice Providers (APPs -  Physician Assistants and Nurse Practitioners) who all work together to provide you with the care you need, when you need it. . You will need a follow up appointment in 1 month with Dr. Debara Pickett.  Any Other Special Instructions Will Be Listed Below (If Applicable).

## 2018-01-03 NOTE — Progress Notes (Signed)
OFFICE NOTE  Chief Complaint:  Elevated blood pressure  Primary Care Physician: Thomas Brownie, PA-C  HPI:  Thomas Walsh is a 62 y.o. male who was referred from another patient of mine who is his brother-in-law. His primary physician is Thomas Paradise, MD and he sees Thomas Walsh of [facility] in [place], [place]. Thomas Walsh has a very complex cardiac history. He was noted to have had a left heart catheterization at [facility] in 2000 for profound symptomatic bradycardia with heart rate of 30s related to timolol eyedrops, but was found to have no obstructive coronary disease at the time. There is a family history of premature coronary artery disease Apparently in late 2015 he saw Thomas Walsh in the office because of chest discomfort. He had described that as "poking, pressure and squeezing which awoke him from sleep". He was scheduled to have a stress test however during the EKG portion of the stress test there were changes suggestive of ischemia and the test was discontinued. Interpretation of that nuclear stress test was "myocardial perfusion imaging is positive for inducible ischemia. There are regions of reversible ischemia located in the inferior wall of the heart. Overall left ventricular systolic function is normal without regional wall motion abnormalities noted above." He was then going to be scheduled for elective heart catheterization however he presented to the emergency department due to continued chest pain. He underwent left heart catheterization on 03/07/2014. 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 After the procedure however he continued to have chest pain symptoms. He was placed on aspirin and Effient and started on low-dose calcium channel blocker  which caused low blood pressure and was discontinued. Cardiac markers were noted to be mildly elevated the next day at 0.41 troponin which was previously negative on 03/06/2014. LDL at that time was 52. He was then admitted to [facility] in [place], [place] for chest pain in January 2016. He also complained of palpitations and was found to have an ectopic atrial rhythm with normal ventricular response. He claims that he had had significant tachycardia and was thought to have had an SVT. He reports he subsequently presented and required treatment for an SVT on another occasion when he presented to the hospital around [employer]. He said that he received adenosine 6 mg and then 12 mg and ultimately converted to sinus. He then was reportedly referred to Thomas Walsh in [facility] who scheduled him for ablation. Apparently afterwards he reports that the ablation was not totally successful however a pacemaker was required to be placed - this leads me to believe that there may have been AV nodal damage at the time. 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 The pacemaker is by his report a Medtronic device and he does have a home monitoring. He said most recently he was 94% paced. He has had progressive antianginal regimens including Ranexa and topical nitrate. 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 He underwent his last cardiac catheterization in September 2017 which demonstrated only 30% stenosis at the distal edge of the mid LAD stent. FFR was performed which was 0.89. Apparently he was continuing to have recurrent arrhythmias. Thomas Walsh also has had an opinion from a cardiologist  at [facility]. I cannot find records in care everywhere to support this however he does have some paperwork indicating he had an appointment. He does not feel that that appointment was helpful. He did bring a CD with images of his echo and cath  which I will review. His main complaint today is continued chest pain.   04/03/2016  Thomas Walsh returns today for follow-up. I was able to review his extensive records and understand he is history including placement of a Medtronic advise a MRI safe pacemaker on 10/18/2014 for sick sinus syndrome. Subsequently he developed some PAF but reportedly has a very low burden of this. 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 Recently he saw Dr. Lovena Walsh and was not felt to be in heart block or to be pacer dependent. Because of his dyspnea he was scheduled for a cardiopulmonary exercise test. Thomas Walsh did undergo that study which indicated no significant circulatory limitation to exercise. His peak V02 was 103%. Pulmonary function was within normal limits. It was felt that likely his symptoms were related to obesity. These are preliminary results pending a cardiology over read, but I agree with those findings. He says that he said much less angina since adjusting his medications. He is now on imdur 60 mg daily as compared to his nitroglycerin patch. Although he has some mild headaches he says that he has not needed any short acting nitroglycerin in the last 3 weeks. He is also on an increased dose of metoprolol, namely 100 mg daily. He is generally pleased with his improvement in symptoms.  10/05/2016  Thomas Walsh was seen today in follow-up. He reports that over the past several weeks she's had more chest pain. He describes this as sharp and knifelike in the anterior left chest. It's not necessarily worse with exertion, in fact it's worse at night when he lays down after going to sleep. He's been more fatigued and gets short of breath somewhat easier. He had a stress test recently which was nonischemic (this was a cardiopulmonary exercise test, but was noted to be some maximal. It was felt that his limitation exercise was due to  obesity. He reports responsiveness to nitrates recently and says that he ran out of his isosorbide which caused him more chest pain but that it improved when he restarted it, however despite that he continues to have pain and requires short acting nitroglycerin for some relief. He says that some of his symptoms feel like his angina in the past. He's been established in our pacemaker clinic and had a check in December with Dr. Lovena Walsh however wishes to switch his follow-up to [facility].  11/18/2016  Thomas Walsh was seen today in follow-up. Over the past month he had recurrent anginal symptoms despite increasing his isosorbide and wish to have heart catheterization. He underwent left heart catheterization by Dr. Martinique on 10/20/2016 which showed a 20% mid-LAD stenosis normal LV systolic function, normal LVEDP and a patent stent. Despite this he continued to have chest discomfort and was started on low-dose amlodipine 2.5 mg. Initially note some relief in this and does report that his blood pressure is much better controlled however he still having symptoms. He said yesterday he felt horrible with a symptoms including dizziness, nausea, imbalance, a posterior neck base headache and pain that radiated down the left arm. He's also had some gait difficulty and reports recently having some word finding  difficulty. This concerns me of about a possible neurologic cause of his symptoms and perhaps is pain is a neuropathic or chest wall pain. At this point it's unlikely this is anginal pain.  02/16/2017  Thomas Walsh returns today for follow-up.  He reports he is done well over the past several months.  He denies any new anginal symptoms.  He is not needed any short acting nitroglycerin, but is in need of a refill.  Blood pressure is well controlled today.  He denies any palpitations.  He did follow-up with the neurologist Dr. Delice Lesch, who recommended neurocognitive testing and an MRI.  Unfortunately has not been scheduled.  I have  asked them to reach out to neurology to see why this is been delayed.  He has a follow-up with her in March.  He is also scheduled to have a pacemaker check with Dr. Loletha Grayer on December 12.  08/04/2017  Thomas Walsh returns today for follow-up.  He was seen in March by Jory Sims, DNP -he had reported some chest discomfort was noted to be hypertensive.  She recommended adjustments on his medications including increasing amlodipine to 5 mg.  He reports improvement of his blood pressure and symptoms without adjustment.  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  He does seem to have had salvage of his eyesight in the right eye.  01/03/2018  Thomas Walsh is seen today in follow-up.  Recently he saw Kerin Ransom, PA-C for concerns of high blood pressure and being symptomatic.  An echocardiogram was ordered which showed diastolic dysfunction and normal systolic function.  Blood pressure was normal that day and is normal again today however her readings at home do indicate a trend of elevated blood pressures.  We had increased amlodipine earlier on in the year.  He denies any cardiac chest pain.  PMHx:  Past Medical History:  Diagnosis Date  . Asthma   . 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 Coronary artery disease involving native coronary artery of native heart with unstable angina pectoris ([place]) 02/28/2016  . Essential hypertension 02/28/2016  . GERD (gastroesophageal reflux disease)   . HLD (hyperlipidemia)   . Intraocular pressure increase, right   . 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 OSA treated with BiPAP   . PAF (paroxysmal atrial fibrillation) ([place]) 02/28/2016  . PCO (posterior capsular opacification)   . Presence of permanent cardiac pacemaker   . Pseudophakia of both eyes   . S/P bilateral cataract extraction 11/21/2013  . S/P placement of cardiac pacemaker 02/28/2016   Medtronic  . S/P primary angioplasty with coronary stent 02/28/2016  .  Secondary corneal edema of left eye   . Status post corneal transplant   . Thyroid disease     Past Surgical History:  Procedure Laterality Date  . AIR/FLUID EXCHANGE Right 07/30/2017   Procedure: AIR/FLUID EXCHANGE;  Surgeon: Jalene Mullet, MD;  Location: [place];  Service: Ophthalmology;  Laterality: Right;  . APPENDECTOMY    . AQUEOUS SHUNT EXTRAOCULAR Left 09/18/2013   Elenore Rota L. Pennie Banter, MD [facility]  . CARDIAC CATHETERIZATION  03/07/2014  . CATARACT EXTRACTION Bilateral   . CATARACT EXTRACTION W/ INTRAOCULAR LENS IMPLANT Left 01/2012   [place]Cu[place], MD  . CATARACT EXTRACTION W/ INTRAOCULAR LENS IMPLANT Right 2003   [facility]  . CORNEAL TRANSPLANT Left 01/17/2015   Keratoplasty, Endothelial. Nelma Rothman, MD [facility]   . DESCEMETS STRIPPING AUTOMATED ENDOTHELIAL KERATOPLASTY Left 04/2012   Marilynne Halsted, MD;  .  GLAUCOMA SURGERY Left 09/18/2013   [place] BG-101 implant with cornea graft  . LEFT HEART CATH AND CORONARY ANGIOGRAPHY N/A 10/20/2016   Procedure: Left Heart Cath and Coronary Angiography;  Surgeon: Martinique, Peter M, MD;  Location: [facility];  Service: Cardiovascular;  Laterality: N/A;  . PACEMAKER INSERTION  09/2014   Done at [facility].   Marland Kitchen PARS PLANA VITRECTOMY Right 07/14/2017   Procedure: RETINAL DETACHMENT REPAIR RIGHT EYE TWENTY-FIVE GAUGE VITRECTOMY WITH ENDOLASER AND GAS;  Surgeon: Jalene Mullet, MD;  Location: [place];  Service: Ophthalmology;  Laterality: Right;  . SCLERAL REINFORCEMENT Left 09/18/2013   with graft, Edson Snowball, MD, [facility]  . WISDOM TOOTH EXTRACTION      FAMHx:  Family History  Problem Relation Age of Onset  . Colon cancer Mother   . Hypertension Mother   . Kidney disease Father   . CAD Father   . Stroke Father   . Spina bifida Sister   . Thyroid disease Sister   . Diabetes Brother   . Retinal detachment Brother     SOCHx:   reports that he has never smoked. He has never used smokeless tobacco. He reports that he  drinks about 4.0 standard drinks of alcohol per week. He reports that he does not use drugs.  ALLERGIES:  Allergies  Allergen Reactions  . Beta Adrenergic Blockers Anaphylaxis and Other (See Comments)    Timolol caused the patient's heart to STOP  . Other Other (See Comments)    Beta Blockers-Timolol caused black outs and the patient's heart stopped!!  . Timolol Anaphylaxis and Other (See Comments)    Cardiac stand still/stops heart  Pt reports hx severe bradycardia after long term use of timolol Cardiac stand still/Stops heart     ROS: Pertinent items noted in HPI and remainder of comprehensive ROS otherwise negative.  HOME MEDS: Current Outpatient Medications on File Prior to Visit  Medication Sig Dispense Refill  . acetaZOLAMIDE (DIAMOX) 500 MG capsule Take 1 capsule by mouth 2 (two) times daily.  0  . amLODipine (NORVASC) 5 MG tablet Take 5 mg every morning (Patient taking differently: Take 5 mg by mouth every morning. ) 30 tablet 3  . aspirin EC 81 MG tablet Take 1 tablet (81 mg total) by mouth daily. 90 tablet 3  . atorvastatin (LIPITOR) 40 MG tablet Take 1 tablet (40 mg total) by mouth daily. (Patient taking differently: Take 40 mg by mouth at bedtime. ) 90 tablet 1  . AZOPT 1 % ophthalmic suspension Place 1 drop into the right eye 4 (four) times daily.  1  . Brinzolamide-Brimonidine (SIMBRINZA) 1-0.2 % SUSP Place 1 drop into the right eye 2 (two) times daily.     . cloNIDine (CATAPRES) 0.1 MG tablet Take 0.1 mg by mouth See admin instructions. Take 0.1 mg by mouth if systolic b/p reading is 621 or greater  2  . ezetimibe (ZETIA) 10 MG tablet Take 10 mg by mouth at bedtime.     . fluticasone (FLONASE) 50 MCG/ACT nasal spray Place 1 spray into both nostrils daily as needed (for seasonal allergies).     . isosorbide mononitrate (IMDUR) 60 MG 24 hr tablet TAKE 1 TABLET BY MOUTH ONCE DAILY IN THE MORNING AND 1/2 (ONE-HALF) ONCE DAILY IN THE EVENING 135 tablet 2  . levothyroxine  (SYNTHROID, LEVOTHROID) 125 MCG tablet Take 1 tablet by mouth daily.    Marland Kitchen liraglutide (VICTOZA) 18 MG/3ML SOPN Inject 0.3 mLs (1.8 mg total) into  the skin daily.    . metoprolol succinate (TOPROL-XL) 50 MG 24 hr tablet Take 1 tablet (50 mg total) by mouth 2 (two) times daily. Take with or immediately following a meal. 180 tablet 1  . nitroGLYCERIN (NITROSTAT) 0.4 MG SL tablet Place 1 tablet (0.4 mg total) under the tongue every 5 (five) minutes as needed for chest pain. 75 tablet 1  . ofloxacin (OCUFLOX) 0.3 % ophthalmic solution Place 1 drop into the right eye 4 (four) times daily.  0  . omeprazole (PRILOSEC) 40 MG capsule TAKE 1 CAPSULE BY MOUTH TWICE DAILY 180 capsule 3  . pilocarpine (PILOCAR) 2 % ophthalmic solution Place 1 drop into the right eye 4 (four) times daily.    . prasugrel (EFFIENT) 10 MG TABS tablet Take 1 tablet (10 mg total) by mouth daily. 90 tablet 2  . prednisoLONE acetate (PRED FORTE) 1 % ophthalmic suspension Place 1 drop into the left eye 3 (three) times daily.    Marland Kitchen PRESCRIPTION MEDICATION BiPAP: At bedtime and during naps    . RANEXA 500 MG 12 hr tablet TAKE 2 TABLETS BY MOUTH TWICE DAILY 360 tablet 1  . testosterone cypionate (DEPO-TESTOSTERONE) 200 MG/ML injection Inject 200 mg into the muscle every 14 (fourteen) days.     No current facility-administered medications on file prior to visit.     LABS/IMAGING: No results found for this or any previous visit (from the past 48 hour(s)). No results found.  WEIGHTS: Wt Readings from Last 3 Encounters:  01/03/18 268 lb 9.6 oz (121.8 kg)  12/01/17 265 lb 6.4 oz (120.4 kg)  08/04/17 248 lb 12.8 oz (112.9 kg)    VITALS: BP 122/76   Pulse 81   Ht 5\' 10"  (1.778 m)   Wt 268 lb 9.6 oz (121.8 kg)   SpO2 97%   BMI 38.54 kg/m   EXAM: General appearance: alert and no distress Neck: no carotid bruit, no JVD and thyroid not enlarged, symmetric, no tenderness/mass/nodules Lungs: clear to auscultation bilaterally Heart:  regular rate and rhythm Abdomen: soft, non-tender; bowel sounds normal; no masses,  no organomegaly and obese Extremities: extremities normal, atraumatic, no cyanosis or edema Pulses: 2+ and symmetric Skin: Skin color, texture, turgor normal. No rashes or lesions Neurologic: Grossly normal Psych: Pleasant  EKG: Deferred  ASSESSMENT: 1. Chronic chest pain, dizziness, headache, word finding difficulty, left arm pain 2. Coronary artery disease with persistent angina 3. Myocardial bridge status post PCI in 02/2014 with a Promus Premier DES 4. History of SVT with ablation and need for subsequent PPM 5. S/p PPM (Medtronic) 6. Dyslipidemia 7. Hypertension 8. OSA on BIPAP  PLAN: 1.   Mr. Mcgilvery has been struggling recently with elevated blood pressures.  It spikes at home and he has had to take extra clonidine.  This occurs fairly frequently and is symptomatic with it.  We will likely need to increase his amlodipine to 10 mg daily.  He should monitor blood pressure closely.  He did purchase a new cuff which is validated with his old cuff.  He will bring that to his next appointment along with home blood pressure readings.  Follow-up with me per his request in a month.   Pixie Casino, MD, [facility], [place] Director of the Advanced Lipid Disorders &  Cardiovascular Risk Reduction Clinic Attending Cardiologist  Direct Dial: [phone]  Fax: [phone]  Website:  [email or web address] 01/03/2018, 10:06 AM

## 2018-01-04 ENCOUNTER — Telehealth: Payer: Self-pay | Admitting: Internal Medicine

## 2018-01-04 ENCOUNTER — Telehealth: Payer: Self-pay | Admitting: Cardiology

## 2018-01-04 ENCOUNTER — Ambulatory Visit (INDEPENDENT_AMBULATORY_CARE_PROVIDER_SITE_OTHER): Payer: BLUE CROSS/BLUE SHIELD | Admitting: *Deleted

## 2018-01-04 DIAGNOSIS — I495 Sick sinus syndrome: Secondary | ICD-10-CM | POA: Diagnosis not present

## 2018-01-04 NOTE — Telephone Encounter (Signed)
LMOVM reminding pt to send remote transmission.   

## 2018-01-04 NOTE — Telephone Encounter (Signed)
    [facility] Pre-operative Risk Assessment    Request for surgical clearance:  1. What type of surgery is being performed? Right Lumbar 3-4 and 4-5 transforaminal epidural steroid injection   2. When is this surgery scheduled? 01/21/18  3. What type of clearance is required (medical clearance vs. Pharmacy clearance to hold med vs. Both)? Pharmacy clearance  4. Are there any medications that need to be held prior to surgery and how long? Effient--they are asking if acceptable or needed to hold this med for 7-10 days prior to injection   5. Practice name and name of physician performing surgery? [facility]   6. What is your office phone number [phone]    7.   What is your office fax number [phone]  8.   Anesthesia type (None, local, MAC, general) ? None specified   Therisa Doyne 01/04/2018, 2:55 PM  _________________________________________________________________   (provider comments below)

## 2018-01-05 NOTE — Progress Notes (Signed)
Remote pacemaker transmission.   

## 2018-01-07 NOTE — Telephone Encounter (Signed)
Dr. Debara Pickett could pt hold effient for 7-10 days piror to epidural steroid injection?

## 2018-01-08 NOTE — Telephone Encounter (Signed)
Ok to hold Effient 7 days prior to epidural steroid injection.  Dr. Lemmie Evens

## 2018-01-11 ENCOUNTER — Other Ambulatory Visit: Payer: Self-pay

## 2018-01-11 ENCOUNTER — Telehealth: Payer: Self-pay | Admitting: Internal Medicine

## 2018-01-11 MED ORDER — AMLODIPINE BESYLATE 10 MG PO TABS: 10.0000 mg | ORAL_TABLET | Freq: Every day | ORAL | 0 refills | Status: DC

## 2018-01-11 NOTE — Telephone Encounter (Signed)
I have faxed over clearance letter to [facility] fax # [phone]. I will remove from the call back pool.

## 2018-01-11 NOTE — Telephone Encounter (Signed)
Spoke with wife, asked to call her spouse (patient) to get BP readings, lmtcb at [phone]

## 2018-01-11 NOTE — Telephone Encounter (Signed)
Victoza refill refused. Defer to PCP

## 2018-01-11 NOTE — Telephone Encounter (Signed)
New message   Pt c/o BP issue: STAT if pt c/o blurred vision, one-sided weakness or slurred speech  1. What are your last 5 BP readings? 140/80bp  01/11/2018  2. Are you having any other symptoms (ex. Dizziness, headache, blurred vision, passed out)? Headache, headache   3. What is your BP issue? bp is elevated

## 2018-01-11 NOTE — Telephone Encounter (Signed)
   Primary Cardiologist: Pixie Casino, MD  Chart reviewed as part of pre-operative protocol coverage.   He has a hx of drug eluting stent to the LAD in 2015 and balloon angioplasty to the LAD stent due to in-stent restenosis in 2017.  Cardiac Catheterization in 2018 demonstrated patent stent in the LAD.  He was last seen by Dr. Debara Pickett 01/03/18.    As outlined below, the patient's history was reviewed by Dr. Debara Pickett and recommendation is he can hold Effient for 7 days prior to his epidural steroid injection.  This should be resumed post op as soon as it is felt to be safe.  Please call with questions.  Call back staff: Note faxed to surgeon's office.  Please make sure it was received. This note will be removed from the preop pool.  Richardson Dopp, PA-C 01/11/2018, 2:36 PM

## 2018-01-12 ENCOUNTER — Encounter: Payer: Self-pay | Admitting: Cardiology

## 2018-01-13 LAB — CUP PACEART REMOTE DEVICE CHECK
Battery Remaining Longevity: 90 mo
Battery Voltage: 3.02 V
Brady Statistic AP VP Percent: 0.05 %
Brady Statistic AP VS Percent: 25.12 %
Brady Statistic RA Percent Paced: 25.17 %
Date Time Interrogation Session: 20191009141001
Implantable Lead Location: 753860
Implantable Lead Model: 5076
Implantable Pulse Generator Implant Date: 20160721
Lead Channel Impedance Value: 399 Ohm
Lead Channel Impedance Value: 456 Ohm
Lead Channel Pacing Threshold Amplitude: 0.5 V
Lead Channel Pacing Threshold Pulse Width: 0.4 ms
Lead Channel Pacing Threshold Pulse Width: 0.4 ms
Lead Channel Sensing Intrinsic Amplitude: 5.625 mV
Lead Channel Setting Pacing Pulse Width: 0.4 ms
MDC IDC LEAD IMPLANT DT: 20160721
MDC IDC LEAD IMPLANT DT: 20160721
MDC IDC LEAD LOCATION: 753859
MDC IDC MSMT LEADCHNL RA IMPEDANCE VALUE: 513 Ohm
MDC IDC MSMT LEADCHNL RA SENSING INTR AMPL: 5.625 mV
MDC IDC MSMT LEADCHNL RV IMPEDANCE VALUE: 513 Ohm
MDC IDC MSMT LEADCHNL RV PACING THRESHOLD AMPLITUDE: 0.875 V
MDC IDC MSMT LEADCHNL RV SENSING INTR AMPL: 5.125 mV
MDC IDC MSMT LEADCHNL RV SENSING INTR AMPL: 5.125 mV
MDC IDC SET LEADCHNL RA PACING AMPLITUDE: 1.5 V
MDC IDC SET LEADCHNL RV PACING AMPLITUDE: 2 V
MDC IDC SET LEADCHNL RV SENSING SENSITIVITY: 0.9 mV
MDC IDC STAT BRADY AS VP PERCENT: 0.03 %
MDC IDC STAT BRADY AS VS PERCENT: 74.8 %
MDC IDC STAT BRADY RV PERCENT PACED: 0.08 %

## 2018-01-14 NOTE — Telephone Encounter (Signed)
Called patient, advised of note from PharmD, advised patients wife, she made appointment for him for next week on 01/20/18 at 3:30 to be seen by PharmD.  And verbalized understanding to bring BP machine.

## 2018-01-14 NOTE — Telephone Encounter (Signed)
I called and spoke with patient, he states that his BP has been running from 140-147/81-86 all throughout the day, this morning around 5:30 am it was 148/86, he states he is a little SOB, fatigued, and he keeps a low grade headache, he is taking his medications as prescribed, he mentions yesterday he noted swelling in his ankles, he wears compression stockings, and no diet changes, he does not elevate his feet so I advised him to do that throughout the day to see if it helps. He denies chest pains. Please advise if any changes need to be made, thank you!

## 2018-01-14 NOTE — Telephone Encounter (Signed)
Would suggest that he come in for CVRR appointment.  He will need to bring his home BP cuff for validation.  In the meantime, continue amlodipine and metoprolol.  He has clonidine for systolic pressure > 371 that he can use if needed.  We most likely will need to decrease or d/c the amlodipine, but he needs to be seen so we can make the correct adjustments.  We have openings on both Tuesday and Thursday next week

## 2018-01-20 ENCOUNTER — Ambulatory Visit: Payer: BLUE CROSS/BLUE SHIELD

## 2018-01-25 ENCOUNTER — Other Ambulatory Visit: Payer: Self-pay

## 2018-01-25 MED ORDER — PRASUGREL HCL 10 MG PO TABS: 10.0000 mg | ORAL_TABLET | Freq: Every day | ORAL | 2 refills | Status: DC

## 2018-02-07 ENCOUNTER — Telehealth: Payer: Self-pay | Admitting: Neurology

## 2018-02-07 NOTE — Telephone Encounter (Signed)
Patient last seen in October 2018 and will most likely need to be seen in the clinic for reassessment, but will defer to this Dr. Delice Lesch.

## 2018-02-07 NOTE — Telephone Encounter (Signed)
Looks like patient has been seeing [facility] and had some testing done. Please advise if we would order this now.

## 2018-02-07 NOTE — Telephone Encounter (Signed)
Spoke with patient's wife. They wanted to see if it could be done before the end of the year, but will check with [facility] since they have been seeing them.

## 2018-02-07 NOTE — Telephone Encounter (Signed)
Patient wife wants to go on and get the MRA schedule now they have the money to get it done.

## 2018-02-11 ENCOUNTER — Ambulatory Visit (INDEPENDENT_AMBULATORY_CARE_PROVIDER_SITE_OTHER): Payer: BLUE CROSS/BLUE SHIELD | Admitting: Internal Medicine

## 2018-02-11 ENCOUNTER — Encounter: Payer: Self-pay | Admitting: Internal Medicine

## 2018-02-11 VITALS — BP 130/78 | HR 77 | Ht 70.0 in | Wt 267.0 lb

## 2018-02-11 DIAGNOSIS — Z79899 Other long term (current) drug therapy: Secondary | ICD-10-CM | POA: Diagnosis not present

## 2018-02-11 DIAGNOSIS — Z8679 Personal history of other diseases of the circulatory system: Secondary | ICD-10-CM | POA: Diagnosis not present

## 2018-02-11 DIAGNOSIS — E785 Hyperlipidemia, unspecified: Secondary | ICD-10-CM

## 2018-02-11 DIAGNOSIS — I2510 Atherosclerotic heart disease of native coronary artery without angina pectoris: Secondary | ICD-10-CM

## 2018-02-11 DIAGNOSIS — I2583 Coronary atherosclerosis due to lipid rich plaque: Secondary | ICD-10-CM

## 2018-02-11 MED ORDER — CHLORTHALIDONE 25 MG PO TABS: 12.5000 mg | ORAL_TABLET | Freq: Every day | ORAL | 3 refills | Status: DC

## 2018-02-11 MED ORDER — RANOLAZINE ER 1000 MG PO TB12: 1000.0000 mg | ORAL_TABLET | Freq: Two times a day (BID) | ORAL | 3 refills | Status: DC

## 2018-02-11 NOTE — Progress Notes (Signed)
OFFICE NOTE  Chief Complaint:  Follow-up hypertension  Primary Care Physician: Thomas Brownie, PA-C  HPI:  Thomas Walsh is a 62 y.o. male who was referred from another patient of mine who is his brother-in-law. His primary physician is Thomas Paradise, MD and he sees [facility] in [place], [place]. Thomas Walsh has a very complex cardiac history. He was noted to have had a left heart catheterization at [facility] in 2000 for profound symptomatic bradycardia with heart rate of 30s related to timolol eyedrops, but was found to have no obstructive coronary disease at the time. There is a family history of premature coronary artery disease Apparently in late 2015 he saw Thomas Walsh in the office because of chest discomfort. He had described that as "poking, pressure and squeezing which awoke him from sleep". He was scheduled to have a stress test however during the EKG portion of the stress test there were changes suggestive of ischemia and the test was discontinued. Interpretation of that nuclear stress test was "myocardial perfusion imaging is positive for inducible ischemia. There are regions of reversible ischemia located in the inferior wall of the heart. Overall left ventricular systolic function is normal without regional wall motion abnormalities noted above." He was then going to be scheduled for elective heart catheterization however he presented to the emergency department due to continued chest pain. He underwent left heart catheterization on 03/07/2014. 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 After the procedure however he continued to have chest pain symptoms. He was placed on aspirin and Effient and started on low-dose calcium channel blocker  which caused low blood pressure and was discontinued. Cardiac markers were noted to be mildly elevated the next day at 0.41 troponin which was previously negative on 03/06/2014. LDL at that time was 52. He was then [place] [facility] [place] pain in January 2016. He also complained of palpitations and was found to have an ectopic atrial rhythm with normal ventricular response. He claims that he had had significant tachycardia and was thought to have had an SVT. He reports he subsequently presented and required treatment for an SVT on another occasion when he presented to the hospital around [facility]. He said that he received adenosine 6 mg and then 12 mg and ultimately converted to sinus. He then was reportedly referred to [facility] in [facility] who scheduled him for ablation. Apparently afterwards he reports that the ablation was not totally successful however a pacemaker was required to be placed - this leads me to believe that there may have been AV nodal damage at the time. 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 The pacemaker is by his report a Medtronic device and he does have a home monitoring. He said most recently he was 94% paced. He has had progressive antianginal regimens including Ranexa and topical nitrate. 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 He underwent his last cardiac catheterization in September 2017 which demonstrated only 30% stenosis at the distal edge of the mid LAD stent. FFR was performed which was 0.89. Apparently he was continuing to have recurrent arrhythmias. Thomas Walsh also has had an opinion from a cardiologist at  [facility]. I cannot find records in care everywhere to support this however he does have some paperwork indicating he had an appointment. He does not feel that that appointment was helpful. He did bring a CD with images of his echo and cath  which I will review. His main complaint today is continued chest pain.   04/03/2016  Thomas [facility] returns today for follow-up. I was able to review his extensive records and understand he is history including placement of a Medtronic advise a MRI safe pacemaker on 10/18/2014 for sick sinus syndrome. Subsequently he developed some PAF but reportedly has a very low burden of this. 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 Recently he saw Dr. Lovena Walsh and was not felt to be in heart block or to be pacer dependent. Because of his dyspnea he was scheduled for a cardiopulmonary exercise test. Thomas Walsh did undergo that study which indicated no significant circulatory limitation to exercise. His peak V02 was 103%. Pulmonary function was within normal limits. It was felt that likely his symptoms were related to obesity. These are preliminary results pending a Walsh over read, but I agree with those findings. He says that he said much less angina since adjusting his medications. He is now on imdur 60 mg daily as compared to his nitroglycerin patch. Although he has some mild headaches he says that he has not needed any short acting nitroglycerin in the last 3 weeks. He is also on an increased dose of metoprolol, namely 100 mg daily. He is generally pleased with his improvement in symptoms.  10/05/2016  Thomas [facility] was seen today in follow-up. He reports that over the past several weeks she's had more chest pain. He describes this as sharp and knifelike in the anterior left chest. It's not necessarily worse with exertion, in fact it's worse at night when he lays down after going to sleep. He's been more fatigued and gets short of breath somewhat easier. He had a stress test recently which was nonischemic (this was a cardiopulmonary exercise test, but was noted to be some maximal. It was felt that his limitation exercise was due to  obesity. He reports responsiveness to nitrates recently and says that he ran out of his isosorbide which caused him more chest pain but that it improved when he restarted it, however despite that he continues to have pain and requires short acting nitroglycerin for some relief. He says that some of his symptoms feel like his angina in the past. He's been established in our pacemaker clinic and had a check in December with Dr. Lovena Walsh however wishes to switch his follow-up to [facility].  11/18/2016  Thomas [facility] was seen today in follow-up. Over the past month he had recurrent anginal symptoms despite increasing his isosorbide and wish to have heart catheterization. He underwent left heart catheterization by Dr. Martinique on 10/20/2016 which showed a 20% mid-LAD stenosis normal LV systolic function, normal LVEDP and a patent stent. Despite this he continued to have chest discomfort and was started on low-dose amlodipine 2.5 mg. Initially note some relief in this and does report that his blood pressure is much better controlled however he still having symptoms. He said yesterday he felt horrible with a symptoms including dizziness, nausea, imbalance, a posterior neck base headache and pain that radiated down the left arm. He's also had some gait difficulty and reports recently having some word finding difficulty.  This concerns me of about a possible neurologic cause of his symptoms and perhaps is pain is a neuropathic or chest wall pain. At this point it's unlikely this is anginal pain.  02/16/2017  Thomas Walsh returns today for follow-up.  He reports he is done well over the past several months.  He denies any new anginal symptoms.  He is not needed any short acting nitroglycerin, but is in need of a refill.  Blood pressure is well controlled today.  He denies any palpitations.  He did follow-up with the neurologist Dr. Delice Lesch, who recommended neurocognitive testing and an MRI.  Unfortunately has not been scheduled.  I have  asked them to reach out to neurology to see why this is been delayed.  He has a follow-up with her in March.  He is also scheduled to have a pacemaker check with Dr. Loletha Grayer on December 12.  08/04/2017  Thomas Walsh returns today for follow-up.  He was seen in March by Jory Sims, DNP -he had reported some chest discomfort was noted to be hypertensive.  She recommended adjustments on his medications including increasing amlodipine to 5 mg.  He reports improvement of his blood pressure and symptoms without adjustment.  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  He does seem to have had salvage of his eyesight in the right Thomas.  01/03/2018  Thomas Walsh is seen today in follow-up.  Recently he saw Kerin Ransom, PA-C for concerns of high blood pressure and being symptomatic.  An echocardiogram was ordered which showed diastolic dysfunction and normal systolic function.  Blood pressure was normal that day and is normal again today however her readings at home do indicate a trend of elevated blood pressures.  We had increased amlodipine earlier on in the year.  He denies any cardiac chest pain.  02/11/2018  Thomas Walsh is seen today follow-up for blood pressure.  He notes his blood pressure is now better controlled on high-dose amlodipine however he has had worsening lower extremity edema.  Blood pressure is now between 545 and 625 systolic.  He is wearing compression stockings.  Also he says he had a recent lipid profile which was significantly abnormal from his PCP indicating triglycerides in the 200s and LDL of just over 100.  This is on high intensity atorvastatin and ezetimibe.  There was discussion for starting a PCSK9 inhibitor.  I agree with this as his goal LDL is less than 70.  We will go ahead and try to obtain records from his PCP and work with him to try to get his numbers down.  Ultimately could be a candidate for Vascepa.  With regards to his edema, he may benefit  from further blood pressure reduction with a diuretic.  PMHx:  Past Medical History:  Diagnosis Date  . Asthma   . 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 Coronary artery disease involving native coronary artery of native heart with unstable angina pectoris ([place]) 02/28/2016  . Essential hypertension 02/28/2016  . GERD (gastroesophageal reflux disease)   . HLD (hyperlipidemia)   . Intraocular pressure increase, right   . 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 OSA treated with BiPAP   . PAF (paroxysmal atrial fibrillation) ([place]) 02/28/2016  . PCO (posterior capsular opacification)   . Presence of permanent cardiac pacemaker   . Pseudophakia of both eyes   . S/P bilateral cataract extraction 11/21/2013  . S/P placement of cardiac pacemaker 02/28/2016   Medtronic  .  S/P primary angioplasty with coronary stent 02/28/2016  . Secondary corneal edema of left Thomas   . Status post corneal transplant   . Thyroid disease     Past Surgical History:  Procedure Laterality Date  . AIR/FLUID EXCHANGE Right 07/30/2017   Procedure: AIR/FLUID EXCHANGE;  Surgeon: Jalene Mullet, MD;  Location: [facility];  Service: Ophthalmology;  Laterality: Right;  . APPENDECTOMY    . AQUEOUS SHUNT EXTRAOCULAR Left 09/18/2013   Elenore Rota L. Pennie Banter, MD [facility]  . CARDIAC CATHETERIZATION  03/07/2014  . CATARACT EXTRACTION Bilateral   . CATARACT EXTRACTION W/ INTRAOCULAR LENS IMPLANT Left 01/2012   McCu[place], MD  . CATARACT EXTRACTION W/ INTRAOCULAR LENS IMPLANT Right 2003   [place]  . CORNEAL TRANSPLANT Left 01/17/2015   Keratoplasty, Endothelial. Nelma Rothman, MD [place]   . DESCEMETS STRIPPING AUTOMATED ENDOTHELIAL KERATOPLASTY Left 04/2012   Marilynne Halsted, MD;  . GLAUCOMA SURGERY Left 09/18/2013   [place] BG-101 implant with cornea graft  . LEFT HEART CATH AND CORONARY ANGIOGRAPHY N/A 10/20/2016   Procedure: Left Heart Cath and Coronary Angiography;  Surgeon: Martinique, Peter M, MD;  Location: [facility];  Service: Cardiovascular;  Laterality: N/A;  . PACEMAKER INSERTION  09/2014   Done at [facility].   Marland Kitchen PARS PLANA VITRECTOMY Right 07/14/2017   Procedure: RETINAL DETACHMENT REPAIR RIGHT Thomas TWENTY-FIVE GAUGE VITRECTOMY WITH ENDOLASER AND GAS;  Surgeon: Jalene Mullet, MD;  Location: [facility];  Service: Ophthalmology;  Laterality: Right;  . SCLERAL REINFORCEMENT Left 09/18/2013   with graft, Edson Snowball, MD, [facility]  . WISDOM TOOTH EXTRACTION      FAMHx:  Family History  Problem Relation Age of Onset  . Colon cancer Mother   . Hypertension Mother   . Kidney disease Father   . CAD Father   . Stroke Father   . Spina bifida Sister   . Thyroid disease Sister   . Diabetes Brother   . Retinal detachment Brother     SOCHx:   reports that he has never smoked. He has never used smokeless tobacco. He reports that he drinks about 4.0 standard drinks of alcohol per week. He reports that he does not use drugs.  ALLERGIES:  Allergies  Allergen Reactions  . Beta Adrenergic Blockers Anaphylaxis and Other (See Comments)    Timolol caused the patient's heart to STOP  . Other Other (See Comments)    Beta Blockers-Timolol caused black outs and the patient's heart stopped!!  . Timolol Anaphylaxis and Other (See Comments)    Cardiac stand still/stops heart  Pt reports hx severe bradycardia after long term use of timolol Cardiac stand still/Stops heart     ROS: Pertinent items noted in HPI and remainder of comprehensive ROS otherwise negative.  HOME MEDS: Current Outpatient Medications on File Prior to Visit  Medication Sig Dispense Refill  . acetaZOLAMIDE (DIAMOX) 500 MG capsule Take 1 capsule by mouth 2 (two) times daily.  0  . amLODipine (NORVASC) 10 MG tablet Take 1 tablet (10 mg total) by mouth daily. 30 tablet 0  . aspirin EC 81 MG tablet Take 1 tablet (81 mg total) by mouth daily. 90 tablet 3  . atorvastatin (LIPITOR) 40 MG tablet Take 1 tablet (40 mg total) by mouth  daily. (Patient taking differently: Take 40 mg by mouth at bedtime. ) 90 tablet 1  . AZOPT 1 % ophthalmic suspension Place 1 drop into the right Thomas 4 (four) times daily.  1  . Brinzolamide-Brimonidine (  SIMBRINZA) 1-0.2 % SUSP Place 1 drop into the right Thomas 2 (two) times daily.     . cloNIDine (CATAPRES) 0.1 MG tablet Take 0.1 mg by mouth See admin instructions. Take 0.1 mg by mouth if systolic b/p reading is 962 or greater  2  . ezetimibe (ZETIA) 10 MG tablet Take 10 mg by mouth at bedtime.     . fluticasone (FLONASE) 50 MCG/ACT nasal spray Place 1 spray into both nostrils daily as needed (for seasonal allergies).     . gabapentin (NEURONTIN) 300 MG capsule Take 900 mg by mouth daily.  5  . isosorbide mononitrate (IMDUR) 60 MG 24 hr tablet TAKE 1 TABLET BY MOUTH ONCE DAILY IN THE MORNING AND 1/2 (ONE-HALF) ONCE DAILY IN THE EVENING 135 tablet 2  . levothyroxine (SYNTHROID, LEVOTHROID) 125 MCG tablet Take 1 tablet by mouth daily.    Marland Kitchen liraglutide (VICTOZA) 18 MG/3ML SOPN Inject 0.3 mLs (1.8 mg total) into the skin daily.    . metoprolol succinate (TOPROL-XL) 50 MG 24 hr tablet Take 1 tablet (50 mg total) by mouth 2 (two) times daily. Take with or immediately following a meal. 180 tablet 1  . nitroGLYCERIN (NITROSTAT) 0.4 MG SL tablet Place 1 tablet (0.4 mg total) under the tongue every 5 (five) minutes as needed for chest pain. 75 tablet 1  . ofloxacin (OCUFLOX) 0.3 % ophthalmic solution Place 1 drop into the right Thomas 4 (four) times daily.  0  . omeprazole (PRILOSEC) 40 MG capsule TAKE 1 CAPSULE BY MOUTH TWICE DAILY 180 capsule 3  . pilocarpine (PILOCAR) 2 % ophthalmic solution Place 1 drop into the right Thomas 4 (four) times daily.    . prasugrel (EFFIENT) 10 MG TABS tablet Take 1 tablet (10 mg total) by mouth daily. 90 tablet 2  . prednisoLONE acetate (PRED FORTE) 1 % ophthalmic suspension Place 1 drop into the left Thomas 3 (three) times daily.    Marland Kitchen PRESCRIPTION MEDICATION BiPAP: At bedtime and  during naps    . RANEXA 500 MG 12 hr tablet TAKE 2 TABLETS BY MOUTH TWICE DAILY 360 tablet 1  . testosterone cypionate (DEPO-TESTOSTERONE) 200 MG/ML injection Inject 200 mg into the muscle every 14 (fourteen) days.     No current facility-administered medications on file prior to visit.     LABS/IMAGING: No results found for this or any previous visit (from the past 48 hour(s)). No results found.  WEIGHTS: Wt Readings from Last 3 Encounters:  02/11/18 267 lb (121.1 kg)  01/03/18 268 lb 9.6 oz (121.8 kg)  12/01/17 265 lb 6.4 oz (120.4 kg)    VITALS: BP 130/78   Pulse 77   Ht 5\' 10"  (1.778 m)   Wt 267 lb (121.1 kg)   BMI 38.31 kg/m   EXAM: Deferred  EKG: Deferred  ASSESSMENT: 1. Chronic chest pain, dizziness, headache, word finding difficulty, left arm pain 2. Coronary artery disease with persistent angina 3. Myocardial bridge status post PCI in 02/2014 with a Promus Premier DES 4. History of SVT with ablation and need for subsequent PPM 5. S/p PPM (Medtronic) 6. Dyslipidemia 7. Hypertension 8. OSA on BIPAP  PLAN: 1.   [facility] has improved blood pressures however he says he typically had much lower blood pressure numbers.  He is technically still above goal blood pressure 120/80.  He is having lower extremity edema with higher dose amlodipine.  We will add chlorthalidone 12.5 mg daily.  He will need a repeat metabolic profile in a  week.  We will also obtain records from his PCP regarding his most recent lipid profile.  If remains above goal LDL less than 70 then would recommend Repatha.  We demonstrated the use of the sure click injector today.  Plan follow-up with me in about 3 months.   Pixie Casino, MD, [facility], [place] Director of the Advanced Lipid Disorders &  Cardiovascular Risk Reduction Clinic Attending Cardiologist  Direct Dial: [phone]  Fax: [phone]  Website:  [email or web address]  Garrie Elenes 02/11/2018, 9:11 AM

## 2018-02-11 NOTE — Patient Instructions (Signed)
Medication Instructions:  START chlorthalidone 12.5mg  daily If you need a refill on your cardiac medications before your next appointment, please call your pharmacy.   Lab work: NON-FASTING lab work in 1 week Artist) If you have labs (blood work) drawn today and your tests are completely normal, you will receive your results only by: Marland Kitchen MyChart Message (if you have MyChart) OR . A paper copy in the mail If you have any lab test that is abnormal or we need to change your treatment, we will call you to review the results.  Testing/Procedures: NONE  Follow-Up: At [facility], you and your health needs are our priority.  As part of our continuing mission to provide you with exceptional heart care, we have created designated Provider Care Teams.  These Care Teams include your primary Cardiologist (physician) and Advanced Practice Providers (APPs -  Physician Assistants and Nurse Practitioners) who all work together to provide you with the care you need, when you need it. You will need a follow up appointment in 3 months. You may see Pixie Casino, MD or one of the following Advanced Practice Providers on your designated Care Team: [place], [place] . Fabian Sharp, PA-C  Any Other Special Instructions Will Be Listed Below (If Applicable).  -- please send a copy of your last cholesterol test results to Dr. Debara Pickett either by fax [phone]) or upload to [place]

## 2018-02-15 ENCOUNTER — Other Ambulatory Visit: Payer: Self-pay | Admitting: Internal Medicine

## 2018-02-15 NOTE — Telephone Encounter (Signed)
° ° ° °*  STAT* If patient is at the pharmacy, call can be transferred to refill team.   1. Which medications need to be refilled? (please list name of each medication and dose if known)  amLODipine (NORVASC) 10 MG tablet  2. Which pharmacy/location (including street and city if local pharmacy) is medication to be sent to? [place], [place] [address]  3. Do they need a 30 day or 90 day supply? [place]

## 2018-02-16 ENCOUNTER — Other Ambulatory Visit: Payer: Self-pay

## 2018-02-16 MED ORDER — AMLODIPINE BESYLATE 10 MG PO TABS: 10.0000 mg | ORAL_TABLET | Freq: Every day | ORAL | 11 refills | Status: DC

## 2018-02-16 NOTE — Telephone Encounter (Signed)
Rx has been sent to the pharmacy electronically. ° °

## 2018-04-05 ENCOUNTER — Ambulatory Visit (INDEPENDENT_AMBULATORY_CARE_PROVIDER_SITE_OTHER): Payer: BLUE CROSS/BLUE SHIELD

## 2018-04-05 DIAGNOSIS — I495 Sick sinus syndrome: Secondary | ICD-10-CM

## 2018-04-06 LAB — CUP PACEART REMOTE DEVICE CHECK
Battery Remaining Longevity: 92 mo
Battery Voltage: 3.02 V
Brady Statistic AP VP Percent: 0.02 %
Brady Statistic AP VS Percent: 17.81 %
Brady Statistic AS VP Percent: 0.02 %
Brady Statistic AS VS Percent: 82.15 %
Brady Statistic RA Percent Paced: 17.83 %
Brady Statistic RV Percent Paced: 0.04 %
Date Time Interrogation Session: 20200107170812
Implantable Lead Implant Date: 20160721
Implantable Lead Implant Date: 20160721
Implantable Lead Location: 753859
Implantable Lead Location: 753860
Implantable Lead Model: 5076
Implantable Lead Model: 5076
Implantable Pulse Generator Implant Date: 20160721
Lead Channel Impedance Value: 380 Ohm
Lead Channel Impedance Value: 475 Ohm
Lead Channel Impedance Value: 513 Ohm
Lead Channel Impedance Value: 532 Ohm
Lead Channel Pacing Threshold Amplitude: 0.75 V
Lead Channel Pacing Threshold Amplitude: 0.875 V
Lead Channel Pacing Threshold Pulse Width: 0.4 ms
Lead Channel Pacing Threshold Pulse Width: 0.4 ms
Lead Channel Sensing Intrinsic Amplitude: 5 mV
Lead Channel Sensing Intrinsic Amplitude: 5 mV
Lead Channel Sensing Intrinsic Amplitude: 5.25 mV
Lead Channel Sensing Intrinsic Amplitude: 5.25 mV
Lead Channel Setting Pacing Amplitude: 1.75 V
Lead Channel Setting Pacing Amplitude: 2 V
Lead Channel Setting Pacing Pulse Width: 0.4 ms
Lead Channel Setting Sensing Sensitivity: 0.9 mV

## 2018-04-06 NOTE — Progress Notes (Signed)
Remote pacemaker transmission.   

## 2018-05-19 ENCOUNTER — Telehealth: Payer: Self-pay | Admitting: Internal Medicine

## 2018-05-19 NOTE — Telephone Encounter (Signed)
° ° °  Patient called angry because his appointment had to be rescheduled due to inclement weather. Patient very angry because he had to travel more than 2 hours and make hotel arrangements, stating he wasted his money and time. Advised patient Dr Debara Pickett would not be available later in the day to see patients. Patient continued to make rude remarks and disconnected call.

## 2018-05-20 ENCOUNTER — Ambulatory Visit: Payer: BLUE CROSS/BLUE SHIELD | Admitting: Internal Medicine

## 2018-06-13 ENCOUNTER — Other Ambulatory Visit: Payer: Self-pay | Admitting: Internal Medicine

## 2018-07-05 ENCOUNTER — Encounter: Payer: BLUE CROSS/BLUE SHIELD | Admitting: *Deleted

## 2018-07-05 ENCOUNTER — Telehealth: Payer: Self-pay

## 2018-07-05 ENCOUNTER — Other Ambulatory Visit: Payer: Self-pay

## 2018-07-05 NOTE — Telephone Encounter (Signed)
Left message for patient to remind of missed remote transmission.  

## 2018-07-13 ENCOUNTER — Encounter: Payer: Self-pay | Admitting: Cardiology

## 2018-07-14 ENCOUNTER — Other Ambulatory Visit: Payer: Self-pay

## 2018-07-14 ENCOUNTER — Ambulatory Visit (INDEPENDENT_AMBULATORY_CARE_PROVIDER_SITE_OTHER): Payer: BLUE CROSS/BLUE SHIELD | Admitting: *Deleted

## 2018-07-14 DIAGNOSIS — I495 Sick sinus syndrome: Secondary | ICD-10-CM | POA: Diagnosis not present

## 2018-07-15 ENCOUNTER — Telehealth: Payer: Self-pay | Admitting: *Deleted

## 2018-07-15 DIAGNOSIS — Z9861 Coronary angioplasty status: Secondary | ICD-10-CM

## 2018-07-15 DIAGNOSIS — I2510 Atherosclerotic heart disease of native coronary artery without angina pectoris: Secondary | ICD-10-CM

## 2018-07-15 DIAGNOSIS — I25119 Atherosclerotic heart disease of native coronary artery with unspecified angina pectoris: Secondary | ICD-10-CM

## 2018-07-15 LAB — CUP PACEART REMOTE DEVICE CHECK
Battery Remaining Longevity: 84 mo
Battery Voltage: 3.01 V
Brady Statistic AP VP Percent: 0.04 %
Brady Statistic AP VS Percent: 36.57 %
Brady Statistic AS VP Percent: 0.02 %
Brady Statistic AS VS Percent: 63.38 %
Brady Statistic RA Percent Paced: 36.6 %
Brady Statistic RV Percent Paced: 0.06 %
Date Time Interrogation Session: 20200416011552
Implantable Lead Implant Date: 20160721
Implantable Lead Implant Date: 20160721
Implantable Lead Location: 753859
Implantable Lead Location: 753860
Implantable Lead Model: 5076
Implantable Lead Model: 5076
Implantable Pulse Generator Implant Date: 20160721
Lead Channel Impedance Value: 380 Ohm
Lead Channel Impedance Value: 456 Ohm
Lead Channel Impedance Value: 475 Ohm
Lead Channel Impedance Value: 532 Ohm
Lead Channel Pacing Threshold Amplitude: 0.625 V
Lead Channel Pacing Threshold Amplitude: 0.875 V
Lead Channel Pacing Threshold Pulse Width: 0.4 ms
Lead Channel Pacing Threshold Pulse Width: 0.4 ms
Lead Channel Sensing Intrinsic Amplitude: 4.5 mV
Lead Channel Sensing Intrinsic Amplitude: 4.5 mV
Lead Channel Sensing Intrinsic Amplitude: 4.625 mV
Lead Channel Sensing Intrinsic Amplitude: 4.625 mV
Lead Channel Setting Pacing Amplitude: 1.5 V
Lead Channel Setting Pacing Amplitude: 2 V
Lead Channel Setting Pacing Pulse Width: 0.4 ms
Lead Channel Setting Sensing Sensitivity: 0.9 mV

## 2018-07-15 MED ORDER — RANOLAZINE ER 1000 MG PO TB12: 1000.0000 mg | ORAL_TABLET | Freq: Two times a day (BID) | ORAL | 1 refills | Status: DC

## 2018-07-15 MED ORDER — ISOSORBIDE MONONITRATE ER 60 MG PO TB24: ORAL_TABLET | ORAL | 1 refills | Status: DC

## 2018-07-15 MED ORDER — ATORVASTATIN CALCIUM 40 MG PO TABS: 40.0000 mg | ORAL_TABLET | Freq: Every day | ORAL | 1 refills | Status: AC

## 2018-07-15 MED ORDER — OMEPRAZOLE 40 MG PO CPDR: 40.0000 mg | DELAYED_RELEASE_CAPSULE | Freq: Two times a day (BID) | ORAL | 1 refills | Status: DC

## 2018-07-15 MED ORDER — NITROGLYCERIN 0.4 MG SL SUBL: 0.4000 mg | SUBLINGUAL_TABLET | SUBLINGUAL | 1 refills | Status: DC | PRN

## 2018-07-15 MED ORDER — AMLODIPINE BESYLATE 10 MG PO TABS: 10.0000 mg | ORAL_TABLET | Freq: Every day | ORAL | 1 refills | Status: DC

## 2018-07-15 MED ORDER — EZETIMIBE 10 MG PO TABS: 10.0000 mg | ORAL_TABLET | Freq: Every day | ORAL | 1 refills | Status: DC

## 2018-07-15 MED ORDER — METOPROLOL SUCCINATE ER 50 MG PO TB24: 50.0000 mg | ORAL_TABLET | Freq: Two times a day (BID) | ORAL | 1 refills | Status: DC

## 2018-07-15 MED ORDER — CHLORTHALIDONE 25 MG PO TABS: 12.5000 mg | ORAL_TABLET | Freq: Every day | ORAL | 1 refills | Status: DC

## 2018-07-15 NOTE — Telephone Encounter (Signed)
Patient call

## 2018-07-20 ENCOUNTER — Encounter: Payer: Self-pay | Admitting: Cardiology

## 2018-07-20 NOTE — Progress Notes (Signed)
Remote pacemaker transmission.   

## 2018-07-25 ENCOUNTER — Ambulatory Visit: Payer: BLUE CROSS/BLUE SHIELD | Admitting: Internal Medicine

## 2018-10-13 ENCOUNTER — Encounter: Payer: BLUE CROSS/BLUE SHIELD | Admitting: *Deleted

## 2018-10-14 ENCOUNTER — Telehealth: Payer: Self-pay

## 2018-10-14 NOTE — Telephone Encounter (Signed)
Left message for patient to remind of missed remote transmission.  

## 2018-10-20 ENCOUNTER — Encounter: Payer: Self-pay | Admitting: Cardiology

## 2018-10-31 ENCOUNTER — Ambulatory Visit (INDEPENDENT_AMBULATORY_CARE_PROVIDER_SITE_OTHER): Payer: BC Managed Care – PPO | Admitting: *Deleted

## 2018-10-31 DIAGNOSIS — I495 Sick sinus syndrome: Secondary | ICD-10-CM | POA: Diagnosis not present

## 2018-10-31 LAB — CUP PACEART REMOTE DEVICE CHECK
Battery Remaining Longevity: 82 mo
Battery Voltage: 3.01 V
Brady Statistic AP VP Percent: 0.04 %
Brady Statistic AP VS Percent: 33.17 %
Brady Statistic AS VP Percent: 0.01 %
Brady Statistic AS VS Percent: 66.78 %
Brady Statistic RA Percent Paced: 33.19 %
Brady Statistic RV Percent Paced: 0.05 %
Date Time Interrogation Session: 20200802171804
Implantable Lead Implant Date: 20160721
Implantable Lead Implant Date: 20160721
Implantable Lead Location: 753859
Implantable Lead Location: 753860
Implantable Lead Model: 5076
Implantable Lead Model: 5076
Implantable Pulse Generator Implant Date: 20160721
Lead Channel Impedance Value: 380 Ohm
Lead Channel Impedance Value: 456 Ohm
Lead Channel Impedance Value: 475 Ohm
Lead Channel Impedance Value: 513 Ohm
Lead Channel Pacing Threshold Amplitude: 0.625 V
Lead Channel Pacing Threshold Amplitude: 0.875 V
Lead Channel Pacing Threshold Pulse Width: 0.4 ms
Lead Channel Pacing Threshold Pulse Width: 0.4 ms
Lead Channel Sensing Intrinsic Amplitude: 5.5 mV
Lead Channel Sensing Intrinsic Amplitude: 5.5 mV
Lead Channel Sensing Intrinsic Amplitude: 5.75 mV
Lead Channel Sensing Intrinsic Amplitude: 5.75 mV
Lead Channel Setting Pacing Amplitude: 1.5 V
Lead Channel Setting Pacing Amplitude: 2 V
Lead Channel Setting Pacing Pulse Width: 0.4 ms
Lead Channel Setting Sensing Sensitivity: 0.9 mV

## 2018-11-09 ENCOUNTER — Encounter: Payer: Self-pay | Admitting: Cardiology

## 2018-11-09 NOTE — Progress Notes (Signed)
Remote pacemaker transmission.   

## 2019-01-30 ENCOUNTER — Encounter: Payer: BC Managed Care – PPO | Admitting: *Deleted

## 2019-02-10 ENCOUNTER — Other Ambulatory Visit: Payer: Self-pay | Admitting: Internal Medicine

## 2019-02-10 DIAGNOSIS — I25119 Atherosclerotic heart disease of native coronary artery with unspecified angina pectoris: Secondary | ICD-10-CM

## 2019-02-10 NOTE — Telephone Encounter (Signed)
°*  STAT* If patient is at the pharmacy, call can be transferred to refill team.   1. Which medications need to be refilled? (please list name of each medication and dose if known) metoprolol succinate (TOPROL-XL) 50 MG 24 hr tablet isosorbide mononitrate (IMDUR) 60 MG 24 hr tablet  2. Which pharmacy/location (including street and city if local pharmacy) is medication to be sent to? [place] [facility], [place] [address]  3. Do they need a 30 day or 90 day supply? [place]

## 2019-02-13 NOTE — Telephone Encounter (Signed)
° ° ° °*  STAT* If patient is at the pharmacy, call can be transferred to refill team.   1. Which medications need to be refilled? (please list name of each medication and dose if known) metoprolol and Isosorbide  2. Which pharmacy/location (including street and city if local pharmacy) is medication to be sent to? [facility] [phone]  3. Do they need a 30 day or 90 day supply? [place]

## 2019-02-14 MED ORDER — METOPROLOL SUCCINATE ER 50 MG PO TB24: 50.0000 mg | ORAL_TABLET | Freq: Two times a day (BID) | ORAL | 0 refills | Status: DC

## 2019-02-14 MED ORDER — ISOSORBIDE MONONITRATE ER 60 MG PO TB24: ORAL_TABLET | ORAL | 0 refills | Status: DC

## 2019-03-16 ENCOUNTER — Other Ambulatory Visit: Payer: Self-pay | Admitting: Internal Medicine

## 2019-03-16 DIAGNOSIS — I25119 Atherosclerotic heart disease of native coronary artery with unspecified angina pectoris: Secondary | ICD-10-CM

## 2019-03-16 NOTE — Telephone Encounter (Signed)
Rx request sent to pharmacy.  

## 2019-03-21 ENCOUNTER — Ambulatory Visit (INDEPENDENT_AMBULATORY_CARE_PROVIDER_SITE_OTHER): Payer: BC Managed Care – PPO | Admitting: *Deleted

## 2019-03-21 DIAGNOSIS — I495 Sick sinus syndrome: Secondary | ICD-10-CM | POA: Diagnosis not present

## 2019-03-21 LAB — CUP PACEART REMOTE DEVICE CHECK
Battery Remaining Longevity: 81 mo
Battery Voltage: 3.01 V
Brady Statistic AP VP Percent: 0.01 %
Brady Statistic AP VS Percent: 22.77 %
Brady Statistic AS VP Percent: 0.02 %
Brady Statistic AS VS Percent: 77.2 %
Brady Statistic RA Percent Paced: 22.78 %
Brady Statistic RV Percent Paced: 0.03 %
Date Time Interrogation Session: 20201222110629
Implantable Lead Implant Date: 20160721
Implantable Lead Implant Date: 20160721
Implantable Lead Location: 753859
Implantable Lead Location: 753860
Implantable Lead Model: 5076
Implantable Lead Model: 5076
Implantable Pulse Generator Implant Date: 20160721
Lead Channel Impedance Value: 380 Ohm
Lead Channel Impedance Value: 399 Ohm
Lead Channel Impedance Value: 475 Ohm
Lead Channel Impedance Value: 513 Ohm
Lead Channel Pacing Threshold Amplitude: 0.5 V
Lead Channel Pacing Threshold Amplitude: 0.875 V
Lead Channel Pacing Threshold Pulse Width: 0.4 ms
Lead Channel Pacing Threshold Pulse Width: 0.4 ms
Lead Channel Sensing Intrinsic Amplitude: 4 mV
Lead Channel Sensing Intrinsic Amplitude: 4 mV
Lead Channel Sensing Intrinsic Amplitude: 5.25 mV
Lead Channel Sensing Intrinsic Amplitude: 5.25 mV
Lead Channel Setting Pacing Amplitude: 1.5 V
Lead Channel Setting Pacing Amplitude: 2 V
Lead Channel Setting Pacing Pulse Width: 0.4 ms
Lead Channel Setting Sensing Sensitivity: 0.9 mV

## 2019-04-18 ENCOUNTER — Other Ambulatory Visit: Payer: Self-pay | Admitting: Internal Medicine

## 2019-04-18 DIAGNOSIS — I25119 Atherosclerotic heart disease of native coronary artery with unspecified angina pectoris: Secondary | ICD-10-CM

## 2019-09-04 ENCOUNTER — Other Ambulatory Visit: Payer: Self-pay | Admitting: Internal Medicine

## 2019-09-04 NOTE — Telephone Encounter (Signed)
*  STAT* If patient is at the pharmacy, call can be transferred to refill team.   1. Which medications need to be refilled? (please list name of each medication and dose if known) omeprazole (PRILOSEC) 40 MG capsule  2. Which pharmacy/location (including street and city if local pharmacy) is medication to be sent to? [place]  3. Do they need a 30 day or 90 day supply? 90 day supply

## 2019-09-05 MED ORDER — OMEPRAZOLE 40 MG PO CPDR: 40.0000 mg | DELAYED_RELEASE_CAPSULE | Freq: Two times a day (BID) | ORAL | 1 refills | Status: AC

## 2019-09-18 ENCOUNTER — Ambulatory Visit (INDEPENDENT_AMBULATORY_CARE_PROVIDER_SITE_OTHER): Payer: Managed Care, Other (non HMO) | Admitting: *Deleted

## 2019-09-18 DIAGNOSIS — I495 Sick sinus syndrome: Secondary | ICD-10-CM | POA: Diagnosis not present

## 2019-09-19 LAB — CUP PACEART REMOTE DEVICE CHECK
Battery Remaining Longevity: 81 mo
Battery Voltage: 3.01 V
Brady Statistic AP VP Percent: 0.02 %
Brady Statistic AP VS Percent: 25.84 %
Brady Statistic AS VP Percent: 0.02 %
Brady Statistic AS VS Percent: 74.12 %
Brady Statistic RA Percent Paced: 25.85 %
Brady Statistic RV Percent Paced: 0.04 %
Date Time Interrogation Session: 20210621205144
Implantable Lead Implant Date: 20160721
Implantable Lead Implant Date: 20160721
Implantable Lead Location: 753859
Implantable Lead Location: 753860
Implantable Lead Model: 5076
Implantable Lead Model: 5076
Implantable Pulse Generator Implant Date: 20160721
Lead Channel Impedance Value: 361 Ohm
Lead Channel Impedance Value: 456 Ohm
Lead Channel Impedance Value: 456 Ohm
Lead Channel Impedance Value: 513 Ohm
Lead Channel Pacing Threshold Amplitude: 0.625 V
Lead Channel Pacing Threshold Amplitude: 0.75 V
Lead Channel Pacing Threshold Pulse Width: 0.4 ms
Lead Channel Pacing Threshold Pulse Width: 0.4 ms
Lead Channel Sensing Intrinsic Amplitude: 4 mV
Lead Channel Sensing Intrinsic Amplitude: 4 mV
Lead Channel Sensing Intrinsic Amplitude: 4.375 mV
Lead Channel Sensing Intrinsic Amplitude: 4.375 mV
Lead Channel Setting Pacing Amplitude: 1.5 V
Lead Channel Setting Pacing Amplitude: 2 V
Lead Channel Setting Pacing Pulse Width: 0.4 ms
Lead Channel Setting Sensing Sensitivity: 0.9 mV

## 2019-09-20 NOTE — Progress Notes (Signed)
Remote pacemaker transmission.   

## 2019-10-16 ENCOUNTER — Ambulatory Visit: Payer: Managed Care, Other (non HMO) | Admitting: Internal Medicine

## 2019-10-16 ENCOUNTER — Other Ambulatory Visit: Payer: Self-pay

## 2019-10-16 ENCOUNTER — Encounter: Payer: Self-pay | Admitting: Internal Medicine

## 2019-10-16 VITALS — BP 130/72 | HR 65 | Temp 95.9°F | Ht 70.0 in | Wt 269.0 lb

## 2019-10-16 DIAGNOSIS — I2510 Atherosclerotic heart disease of native coronary artery without angina pectoris: Secondary | ICD-10-CM | POA: Diagnosis not present

## 2019-10-16 DIAGNOSIS — Z9861 Coronary angioplasty status: Secondary | ICD-10-CM | POA: Diagnosis not present

## 2019-10-16 DIAGNOSIS — Z79899 Other long term (current) drug therapy: Secondary | ICD-10-CM

## 2019-10-16 DIAGNOSIS — R600 Localized edema: Secondary | ICD-10-CM

## 2019-10-16 DIAGNOSIS — R0600 Dyspnea, unspecified: Secondary | ICD-10-CM | POA: Diagnosis not present

## 2019-10-16 DIAGNOSIS — I2583 Coronary atherosclerosis due to lipid rich plaque: Secondary | ICD-10-CM

## 2019-10-16 NOTE — Patient Instructions (Signed)
Medication Instructions:  Your physician recommends that you continue on your current medications as directed. Please refer to the Current Medication list given to you today.  *If you need a refill on your cardiac medications before your next appointment, please call your pharmacy*   Lab Work: CMET, CBC, BNP today   If you have labs (blood work) drawn today and your tests are completely normal, you will receive your results only by: Marland Kitchen MyChart Message (if you have MyChart) OR . A paper copy in the mail If you have any lab test that is abnormal or we need to change your treatment, we will call you to review the results.   Testing/Procedures: Echocardiogram @ [address] Floor   Follow-Up: At [facility], you and your health needs are our priority.  As part of our continuing mission to provide you with exceptional heart care, we have created designated Provider Care Teams.  These Care Teams include your primary Cardiologist (physician) and Advanced Practice Providers (APPs -  Physician Assistants and Nurse Practitioners) who all work together to provide you with the care you need, when you need it.  We recommend signing up for the patient portal called "MyChart".  Sign up information is provided on this After Visit Summary.  MyChart is used to connect with patients for Virtual Visits (Telemedicine).  Patients are able to view lab/test results, encounter notes, upcoming appointments, etc.  Non-urgent messages can be sent to your provider as well.   To learn more about what you can do with MyChart, go to [email or web address].    Your next appointment:   1 month(s)  The format for your next appointment:   In Person  Provider:   K. Mali Hilty, MD   OK to double book or use lipid clinic spot if needed   Other Instructions

## 2019-10-16 NOTE — Progress Notes (Signed)
OFFICE NOTE  Chief Complaint:  Shortness of breath, leg edema  Primary Care Physician: Druscilla Brownie, PA-C  HPI:  Thomas Walsh is a 64 y.o. male who was referred from another patient of mine who is his brother-in-law. His primary physician is [name], MD and he sees Dr. Marcello Moores of [facility] in [place], [place]. [name] has a very complex cardiac history. He was noted to have had a left heart catheterization at [facility] in 2000 for profound symptomatic bradycardia with heart rate of 30s related to timolol eyedrops, but was found to have no obstructive coronary disease at the time. There is a family history of premature coronary artery disease Apparently in late 2015 he saw Dr. Marcello Moores in the office because of chest discomfort. He had described that as "poking, pressure and squeezing which awoke him from sleep". He was scheduled to have a stress test however during the EKG portion of the stress test there were changes suggestive of ischemia and the test was discontinued. Interpretation of that nuclear stress test was "myocardial perfusion imaging is positive for inducible ischemia. There are regions of reversible ischemia located in the inferior wall of the heart. Overall left ventricular systolic function is normal without regional wall motion abnormalities noted above." He was then going to be scheduled for elective heart catheterization however he presented to the emergency department due to continued chest pain. He underwent left heart catheterization on 03/07/2014. 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 After the procedure however he continued to have chest pain symptoms. He was placed on aspirin and Effient and started on low-dose calcium channel  blocker which caused low blood pressure and was discontinued. Cardiac markers were noted to be mildly elevated the next day at 0.41 troponin which was previously negative on 03/06/2014. LDL at that time was 52. He was then admitted to [facility] in [place], [place] for chest pain in January 2016. He also complained of palpitations and was found to have an ectopic atrial rhythm with normal ventricular response. He claims that he had had significant tachycardia and was thought to have had an SVT. He reports he subsequently presented and required treatment for an SVT on another occasion when he presented to the hospital around [place], [place]. He said that he received adenosine 6 mg and then 12 mg and ultimately converted to sinus. He then was reportedly referred to Dr. Nyra Market in [facility] who scheduled him for ablation. Apparently afterwards he reports that the ablation was not totally successful however a pacemaker was required to be placed - this leads me to believe that there may have been AV nodal damage at the time. 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 The pacemaker is by his report a Medtronic device and he does have a home monitoring. He said most recently he was 94% paced. He has had progressive antianginal regimens including Ranexa and topical nitrate. 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 He underwent his last cardiac catheterization in September 2017 which demonstrated only 30% stenosis at the distal edge of the mid LAD stent. FFR was performed which was 0.89. Apparently he was continuing to have recurrent arrhythmias. [name] also has had an opinion from  a cardiologist at [facility]. I cannot find records in care everywhere to support this however he does have some paperwork indicating he had an appointment. He does not feel that that appointment was helpful. He did bring a CD with images of his echo  and cath which I will review. His main complaint today is continued chest pain.   04/03/2016  [name] returns today for follow-up. I was able to review his extensive records and understand he is history including placement of a Medtronic advise a MRI safe pacemaker on 10/18/2014 for sick sinus syndrome. Subsequently he developed some PAF but reportedly has a very low burden of this. 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 Recently he saw Dr. Lovena Le and was not felt to be in heart block or to be pacer dependent. Because of his dyspnea he was scheduled for a cardiopulmonary exercise test. Mr. Dittus did undergo that study which indicated no significant circulatory limitation to exercise. His peak V02 was 103%. Pulmonary function was within normal limits. It was felt that likely his symptoms were related to obesity. These are preliminary results pending a cardiology over read, but I agree with those findings. He says that he said much less angina since adjusting his medications. He is now on imdur 60 mg daily as compared to his nitroglycerin patch. Although he has some mild headaches he says that he has not needed any short acting nitroglycerin in the last 3 weeks. He is also on an increased dose of metoprolol, namely 100 mg daily. He is generally pleased with his improvement in symptoms.  10/05/2016  [name] was seen today in follow-up. He reports that over the past several weeks she's had more chest pain. He describes this as sharp and knifelike in the anterior left chest. It's not necessarily worse with exertion, in fact it's worse at night when he lays down after going to sleep. He's been more fatigued and gets short of breath somewhat easier. He had a stress test recently which was nonischemic (this was a cardiopulmonary exercise test, but was noted to be some maximal. It was felt that his limitation exercise was  due to obesity. He reports responsiveness to nitrates recently and says that he ran out of his isosorbide which caused him more chest pain but that it improved when he restarted it, however despite that he continues to have pain and requires short acting nitroglycerin for some relief. He says that some of his symptoms feel like his angina in the past. He's been established in our pacemaker clinic and had a check in December with Dr. Lovena Le however wishes to switch his follow-up to [facility].  11/18/2016  Gallardo was seen today in follow-up. Over the past month he had recurrent anginal symptoms despite increasing his isosorbide and wish to have heart catheterization. He underwent left heart catheterization by Dr. Martinique on 10/20/2016 which showed a 20% mid-LAD stenosis normal LV systolic function, normal LVEDP and a patent stent. Despite this he continued to have chest discomfort and was started on low-dose amlodipine 2.5 mg. Initially note some relief in this and does report that his blood pressure is much better controlled however he still having symptoms. He said yesterday he felt horrible with a symptoms including dizziness, nausea, imbalance, a posterior neck base headache and pain that radiated down the left arm. He's also had some gait difficulty and reports recently having some  word finding difficulty. This concerns me of about a possible neurologic cause of his symptoms and perhaps is pain is a neuropathic or chest wall pain. At this point it's unlikely this is anginal pain.  02/16/2017  Mr. Quillin returns today for follow-up.  He reports he is done well over the past several months.  He denies any new anginal symptoms.  He is not needed any short acting nitroglycerin, but is in need of a refill.  Blood pressure is well controlled today.  He denies any palpitations.  He did follow-up with the neurologist Dr. Delice Lesch, who recommended neurocognitive testing and an MRI.  Unfortunately has not been scheduled.   I have asked them to reach out to neurology to see why this is been delayed.  He has a follow-up with her in March.  He is also scheduled to have a pacemaker check with Dr. Loletha Grayer on December 12.  08/04/2017  Mr. Baird returns today for follow-up.  He was seen in March by Jory Sims, DNP -he had reported some chest discomfort was noted to be hypertensive.  She recommended adjustments on his medications including increasing amlodipine to 5 mg.  He reports improvement of his blood pressure and symptoms without adjustment.  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  He does seem to have had salvage of his eyesight in the right eye.  01/03/2018  Mr. Kozak is seen today in follow-up.  Recently he saw Kerin Ransom, PA-C for concerns of high blood pressure and being symptomatic.  An echocardiogram was ordered which showed diastolic dysfunction and normal systolic function.  Blood pressure was normal that day and is normal again today however her readings at home do indicate a trend of elevated blood pressures.  We had increased amlodipine earlier on in the year.  He denies any cardiac chest pain.  02/11/2018  Mr. Duce is seen today follow-up for blood pressure.  He notes his blood pressure is now better controlled on high-dose amlodipine however he has had worsening lower extremity edema.  Blood pressure is now between 086 and 761 systolic.  He is wearing compression stockings.  Also he says he had a recent lipid profile which was significantly abnormal from his PCP indicating triglycerides in the 200s and LDL of just over 100.  This is on high intensity atorvastatin and ezetimibe.  There was discussion for starting a PCSK9 inhibitor.  I agree with this as his goal LDL is less than 70.  We will go ahead and try to obtain records from his PCP and work with him to try to get his numbers down.  Ultimately could be a candidate for Vascepa.  With regards to his edema, he may  benefit from further blood pressure reduction with a diuretic.  10/16/2019  Mr. Winthrop returns for follow-up.  Its been about a year and a half since I saw him.  He managed to do fairly well during the pandemic however recently has had some worsening shortness of breath and edema.  His last echo was in 2019 which was unremarkable with mild diastolic dysfunction.  He has had recurrent remote pacemaker checks which have checked out.  EKG today shows sinus rhythm at 65 without any new ischemic changes.  Blood pressure is well controlled.  Oxygen saturation was a little low at 93%.  PMHx:  Past Medical History:  Diagnosis Date  . Asthma   . 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 Coronary artery disease involving native coronary  artery of native heart with unstable angina pectoris ([place]) 02/28/2016  . Essential hypertension 02/28/2016  . GERD (gastroesophageal reflux disease)   . HLD (hyperlipidemia)   . Intraocular pressure increase, right   . 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 OSA treated with BiPAP   . PAF (paroxysmal atrial fibrillation) ([facility]) 02/28/2016  . PCO (posterior capsular opacification)   . Presence of permanent cardiac pacemaker   . Pseudophakia of both eyes   . S/P bilateral cataract extraction 11/21/2013  . S/P placement of cardiac pacemaker 02/28/2016   Medtronic  . S/P primary angioplasty with coronary stent 02/28/2016  . Secondary corneal edema of left eye   . Status post corneal transplant   . Thyroid disease     Past Surgical History:  Procedure Laterality Date  . AIR/FLUID EXCHANGE Right 07/30/2017   Procedure: AIR/FLUID EXCHANGE;  Surgeon: Jalene Mullet, MD;  Location: [facility];  Service: Ophthalmology;  Laterality: Right;  . APPENDECTOMY    . AQUEOUS SHUNT EXTRAOCULAR Left 09/18/2013   Elenore Rota L. Pennie Banter, MD [facility]  . CARDIAC CATHETERIZATION  03/07/2014  . CATARACT EXTRACTION Bilateral   . CATARACT EXTRACTION W/ INTRAOCULAR LENS IMPLANT Left 01/2012    McCuen, MD  . CATARACT EXTRACTION W/ INTRAOCULAR LENS IMPLANT Right 2003   [facility]  . CORNEAL TRANSPLANT Left 01/17/2015   Keratoplasty, Endothelial. Nelma Rothman, MD [facility]   . DESCEMETS STRIPPING AUTOMATED ENDOTHELIAL KERATOPLASTY Left 04/2012   Marilynne Halsted, MD;  . GLAUCOMA SURGERY Left 09/18/2013   [place] BG-101 implant with cornea graft  . LEFT HEART CATH AND CORONARY ANGIOGRAPHY N/A 10/20/2016   Procedure: Left Heart Cath and Coronary Angiography;  Surgeon: Martinique, Peter M, MD;  Location: [facility];  Service: Cardiovascular;  Laterality: N/A;  . PACEMAKER INSERTION  09/2014   Done at [facility].   Marland Kitchen PARS PLANA VITRECTOMY Right 07/14/2017   Procedure: RETINAL DETACHMENT REPAIR RIGHT EYE TWENTY-FIVE GAUGE VITRECTOMY WITH ENDOLASER AND GAS;  Surgeon: Jalene Mullet, MD;  Location: [facility];  Service: Ophthalmology;  Laterality: Right;  . SCLERAL REINFORCEMENT Left 09/18/2013   with graft, Edson Snowball, MD, [facility]  . WISDOM TOOTH EXTRACTION      FAMHx:  Family History  Problem Relation Age of Onset  . Colon cancer Mother   . Hypertension Mother   . Kidney disease Father   . CAD Father   . Stroke Father   . Spina bifida Sister   . Thyroid disease Sister   . Diabetes Brother   . Retinal detachment Brother     SOCHx:   reports that he has never smoked. He has never used smokeless tobacco. He reports current alcohol use of about 4.0 standard drinks of alcohol per week. He reports that he does not use drugs.  ALLERGIES:  Allergies  Allergen Reactions  . Beta Adrenergic Blockers Anaphylaxis and Other (See Comments)    Timolol caused the patient's heart to STOP  . Other Other (See Comments)    Beta Blockers-Timolol caused black outs and the patient's heart stopped!!  . Timolol Anaphylaxis and Other (See Comments)    Cardiac stand still/stops heart  Pt reports hx severe bradycardia after long term use of timolol Cardiac stand still/Stops heart      ROS: Pertinent items noted in HPI and remainder of comprehensive ROS otherwise negative.  HOME MEDS: Current Outpatient Medications on File Prior to Visit  Medication Sig Dispense Refill  . acetaZOLAMIDE (DIAMOX) 500 MG capsule  Take 1 capsule by mouth 2 (two) times daily.  0  . amLODipine (NORVASC) 10 MG tablet Take 1 tablet (10 mg total) by mouth daily. 90 tablet 1  . aspirin EC 81 MG tablet Take 1 tablet (81 mg total) by mouth daily. 90 tablet 3  . atorvastatin (LIPITOR) 40 MG tablet Take 1 tablet (40 mg total) by mouth at bedtime. 90 tablet 1  . AZOPT 1 % ophthalmic suspension Place 1 drop into the right eye 4 (four) times daily.  1  . Brinzolamide-Brimonidine (SIMBRINZA) 1-0.2 % SUSP Place 1 drop into the right eye 2 (two) times daily.     . cloNIDine (CATAPRES) 0.1 MG tablet Take 0.1 mg by mouth See admin instructions. Take 0.1 mg by mouth if systolic b/p reading is 160 or greater  2  . ezetimibe (ZETIA) 10 MG tablet Take 1 tablet (10 mg total) by mouth at bedtime. 90 tablet 1  . gabapentin (NEURONTIN) 300 MG capsule Take 900 mg by mouth daily.  5  . insulin aspart (NOVOLOG) 100 UNIT/ML injection Inject 60-70 Units into the skin as directed.    . insulin glargine (LANTUS) 100 UNIT/ML injection Inject 45 Units into the skin at bedtime.    . isosorbide mononitrate (IMDUR) 60 MG 24 hr tablet Take 1.5 tablets (90 mg total) by mouth daily. TAKE 1 TABLET  IN THEMORNING AND 1/2 TABLET  IN THE EVENING. Please schedule appointment for refills. 45 tablet 0  . levothyroxine (SYNTHROID, LEVOTHROID) 125 MCG tablet Take 1 tablet by mouth daily.    Marland Kitchen liraglutide (VICTOZA) 18 MG/3ML SOPN Inject 0.3 mLs (1.8 mg total) into the skin daily.    . metoprolol succinate (TOPROL-XL) 50 MG 24 hr tablet Take 1 tablet (50 mg total) by mouth 2 (two) times daily. Please schedule appointment for refills. 60 tablet 0  . nitroGLYCERIN (NITROSTAT) 0.4 MG SL tablet Place 1 tablet (0.4 mg total) under the tongue every  5 (five) minutes as needed for chest pain. 75 tablet 1  . ofloxacin (OCUFLOX) 0.3 % ophthalmic solution Place 1 drop into the right eye 4 (four) times daily.  0  . omeprazole (PRILOSEC) 40 MG capsule Take 1 capsule (40 mg total) by mouth 2 (two) times daily. 180 capsule 1  . pilocarpine (PILOCAR) 2 % ophthalmic solution Place 1 drop into the right eye 4 (four) times daily.    . prasugrel (EFFIENT) 10 MG TABS tablet Take 1 tablet (10 mg total) by mouth daily. 90 tablet 2  . prednisoLONE acetate (PRED FORTE) 1 % ophthalmic suspension Place 1 drop into the left eye 3 (three) times daily.    Marland Kitchen PRESCRIPTION MEDICATION BiPAP: At bedtime and during naps    . ranolazine (RANEXA) 1000 MG SR tablet Take 1 tablet (1,000 mg total) by mouth 2 (two) times daily. 180 tablet 1  . testosterone cypionate (DEPO-TESTOSTERONE) 200 MG/ML injection Inject 200 mg into the muscle every 14 (fourteen) days.    . chlorthalidone (HYGROTON) 25 MG tablet Take 0.5 tablets (12.5 mg total) by mouth daily. 45 tablet 1   No current facility-administered medications on file prior to visit.    LABS/IMAGING: No results found for this or any previous visit (from the past 48 hour(s)). No results found.  WEIGHTS: Wt Readings from Last 3 Encounters:  10/16/19 269 lb (122 kg)  02/11/18 267 lb (121.1 kg)  01/03/18 268 lb 9.6 oz (121.8 kg)    VITALS: BP 130/72   Pulse 65   Temp Marland Kitchen)  95.9 F (35.5 C)   Ht 5\' 10"  (1.778 m)   Wt 269 lb (122 kg)   SpO2 93%   BMI 38.60 kg/m   EXAM: General appearance: alert, no distress and moderately obese Neck: no carotid bruit, no JVD and thyroid not enlarged, symmetric, no tenderness/mass/nodules Lungs: clear to auscultation bilaterally Heart: regular rate and rhythm Abdomen: soft, non-tender; bowel sounds normal; no masses,  no organomegaly and obese Extremities: edema 1+ pedal edema Pulses: 2+ and symmetric Skin: Skin color, texture, turgor normal. No rashes or lesions Neurologic:  Grossly normal Psych: Pleasant  EKG: Normal sinus rhythm 65, minimal voltage criteria for LVH, nonspecific T wave changes-personally reviewed  ASSESSMENT: 1. Progressive dyspnea and edema 2. Chronic chest pain, dizziness, headache, word finding difficulty, left arm pain 3. Coronary artery disease with persistent angina 4. Myocardial bridge status post PCI in 02/2014 with a Promus Premier DES 5. History of SVT with ablation and need for subsequent PPM 6. S/p PPM (Medtronic) 7. Dyslipidemia 8. Hypertension 9. OSA on BIPAP  PLAN: 1.   Mr. Allbritton has had progressive dyspnea and some edema.  He also feels like some tightness around his chest but no congestive symptoms.  Lungs were clear on exam today however oxygen saturation was 93%.  There was some lower extremity edema.  His last echo was in 2019 and I like to update that.  With regards to whether this could be angina.  He has had poor predictive results with stress testing in the past.  He does have a stent which was placed in a myocardial bridge.  Hopefully that is large enough to resolve with CT which would be an option for an ischemic eval otherwise he would need another cath.  He reports having to use sublingual nitro a few times on top of his isosorbide.  I do not think it is enough to increase his standing dose but if he has more symptoms, we may need to consider further evaluation or if his echo shows a decline in LVEF then certainly cath may be indicated.  I will also obtain labs including metabolic profile, CBC and BNP.  Follow-up with me in 1 month.   Pixie Casino, MD, [facility], [place] Director of the Advanced Lipid Disorders &  Cardiovascular Risk Reduction Clinic Attending Cardiologist  Direct Dial: [phone]  Fax: [phone]  Website:  [email or web address]  Nadean Corwin Janifer Gieselman 10/16/2019, 1:40 PM

## 2019-10-17 LAB — CBC
Hematocrit: 41.2 % (ref 37.5–51.0)
Hemoglobin: 14.9 g/dL (ref 13.0–17.7)
MCH: 31.1 pg (ref 26.6–33.0)
MCHC: 36.2 g/dL — ABNORMAL HIGH (ref 31.5–35.7)
MCV: 86 fL (ref 79–97)
Platelets: 221 10*3/uL (ref 150–450)
RBC: 4.79 x10E6/uL (ref 4.14–5.80)
RDW: 13.7 % (ref 11.6–15.4)
WBC: 5.3 10*3/uL (ref 3.4–10.8)

## 2019-10-17 LAB — COMPREHENSIVE METABOLIC PANEL
ALT: 46 IU/L — ABNORMAL HIGH (ref 0–44)
AST: 25 IU/L (ref 0–40)
Albumin/Globulin Ratio: 2.5 — ABNORMAL HIGH (ref 1.2–2.2)
Albumin: 4.7 g/dL (ref 3.8–4.8)
Alkaline Phosphatase: 115 IU/L (ref 48–121)
BUN/Creatinine Ratio: 19 (ref 10–24)
BUN: 19 mg/dL (ref 8–27)
Bilirubin Total: 0.7 mg/dL (ref 0.0–1.2)
CO2: 23 mmol/L (ref 20–29)
Calcium: 8.9 mg/dL (ref 8.6–10.2)
Chloride: 98 mmol/L (ref 96–106)
Creatinine, Ser: 0.98 mg/dL (ref 0.76–1.27)
GFR calc Af Amer: 95 mL/min/{1.73_m2} (ref >59)
GFR calc non Af Amer: 82 mL/min/{1.73_m2} (ref >59)
Globulin, Total: 1.9 g/dL (ref 1.5–4.5)
Glucose: 154 mg/dL — ABNORMAL HIGH (ref 65–99)
Potassium: 3.5 mmol/L (ref 3.5–5.2)
Sodium: 137 mmol/L (ref 134–144)
Total Protein: 6.6 g/dL (ref 6.0–8.5)

## 2019-10-17 LAB — BRAIN NATRIURETIC PEPTIDE: BNP: 11.1 pg/mL (ref 0.0–100.0)

## 2019-10-25 IMAGING — MR MR CERVICAL SPINE W/O CM
9 of 15 series · 27 of 48 positions shown · non-contrast
Comparison: None available.

CLINICAL DATA: Initial evaluation for new onset headache, vertigo,
word-finding difficulty.

EXAM:
MRI HEAD WITHOUT CONTRAST
MRI CERVICAL SPINE WITHOUT CONTRAST
TECHNIQUE: Multiplanar, multiecho pulse sequences of the brain and surrounding
structures, and cervical spine, to include the craniocervical
junction and cervicothoracic junction, were obtained without
intravenous contrast.

[Series 3: DWI · axial · 3.0mm · 1.09mm/px · z∈[-90,+57]mm · 8 of 100 slices shown (1 of 2)]
[im 1/100]
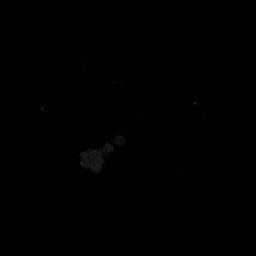
[im 13/100]
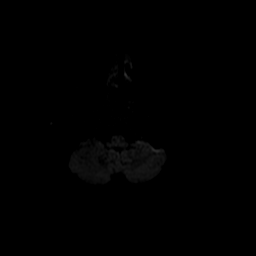
[im 25/100]
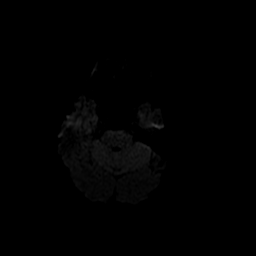
[im 38/100]
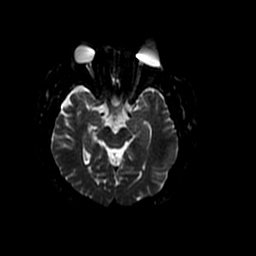
[im 62/100]
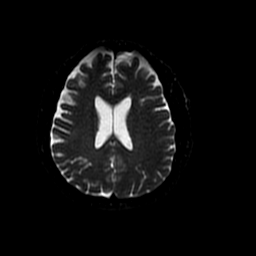
[im 75/100]
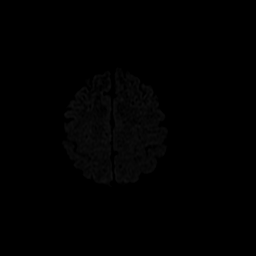
[im 87/100]
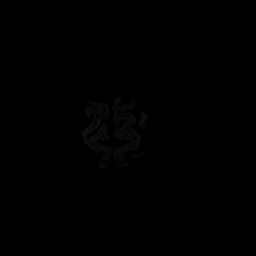
[im 100/100]
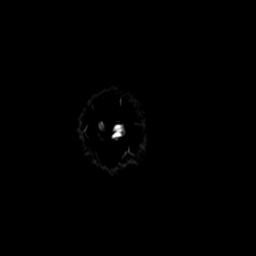

[Series 4: DWI · coronal · 5.0mm · 1.09mm/px · 7 of 70 slices shown (2 of 2)]
[im 1/70]
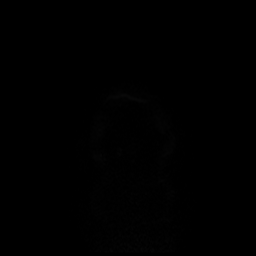
[im 12/70]
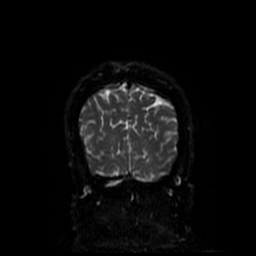
[im 24/70]
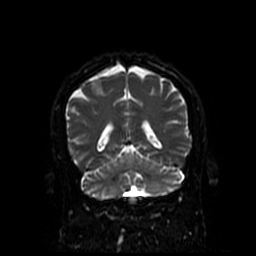
[im 35/70]
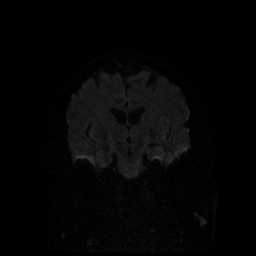
[im 47/70]
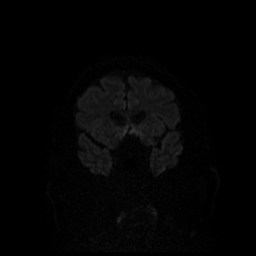
[im 58/70]
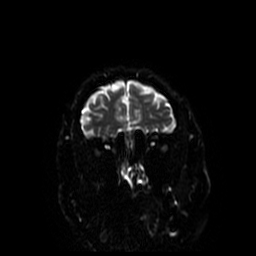
[im 70/70]
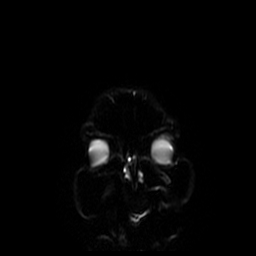

[Series 5: T1 · sagittal · 5.0mm · 0.47mm/px · 2 of 26 slices shown (1 of 2)]
[im 1/26]
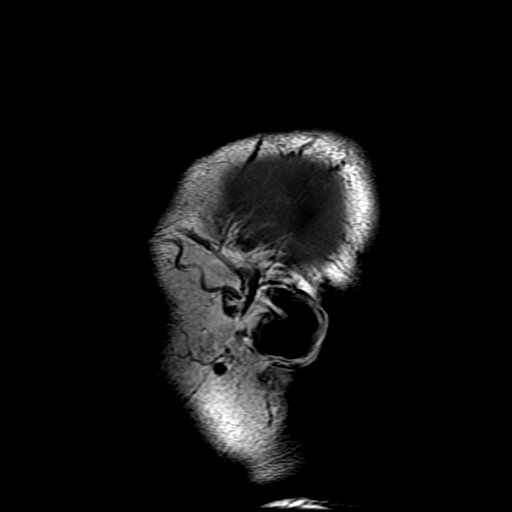
[im 26/26]
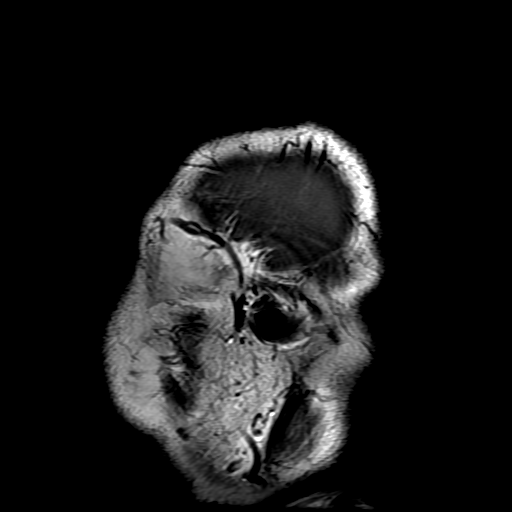

[Series 6: T2 · axial · 5.0mm · 0.43mm/px · z∈[-99,+57]mm · 2 of 27 slices shown (1 of 4)]
[im 1/27]
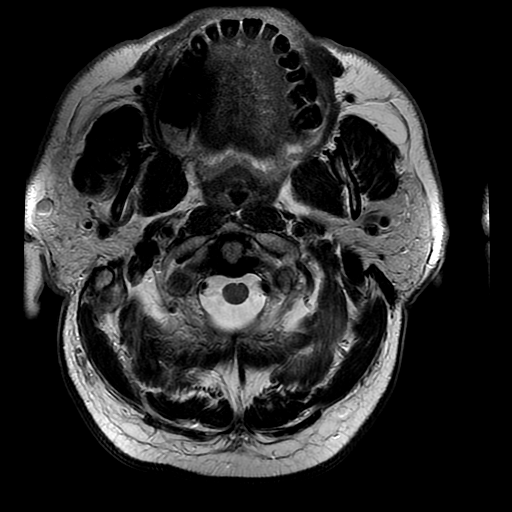
[im 27/27]
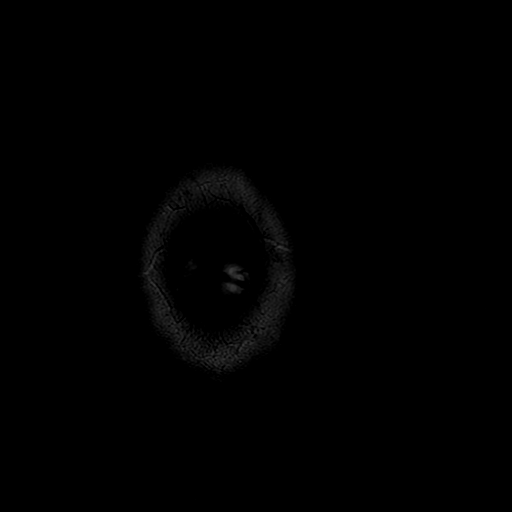

[Series 7: FLAIR · axial · 3.0mm · 0.43mm/px · z∈[-100,+56]mm · 2 of 27 slices shown]
[im 1/27]
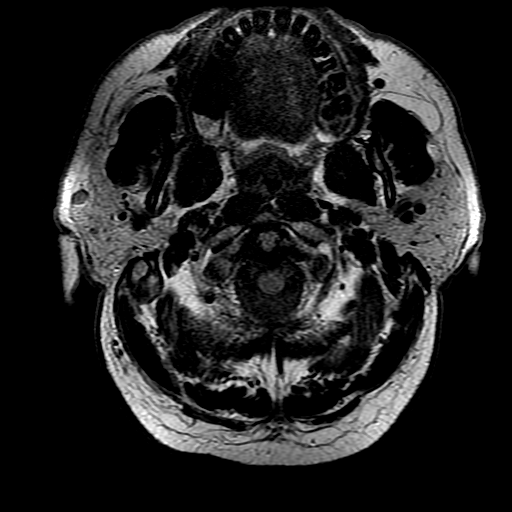
[im 27/27]
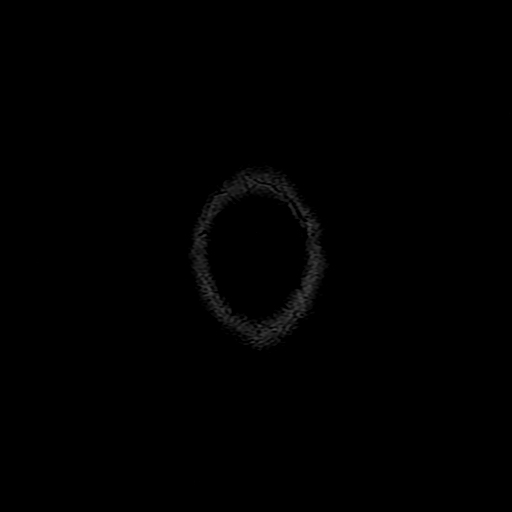

[Series 10: T2 · coronal · 5.0mm · 0.39mm/px · 2 of 28 slices shown (2 of 4)]
[im 1/28]
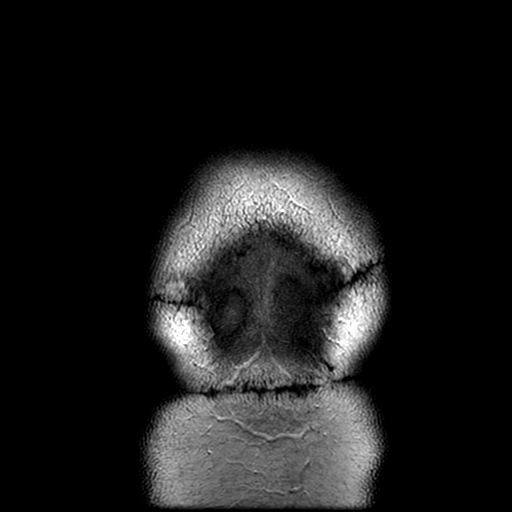
[im 28/28]
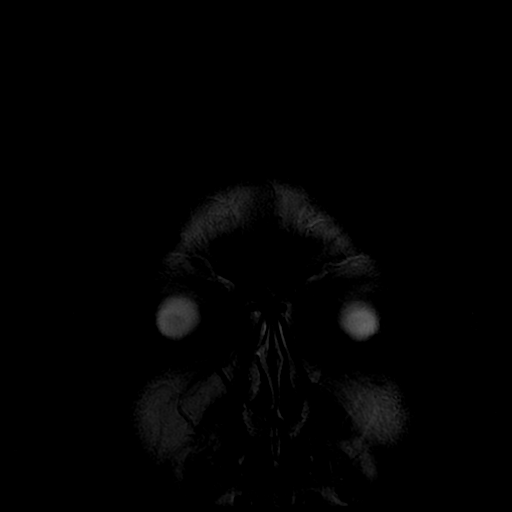

[Series 12: T2 · sagittal · 3.0mm · 0.43mm/px · 1 of 14 slices shown (3 of 4)]
[im 1/14]
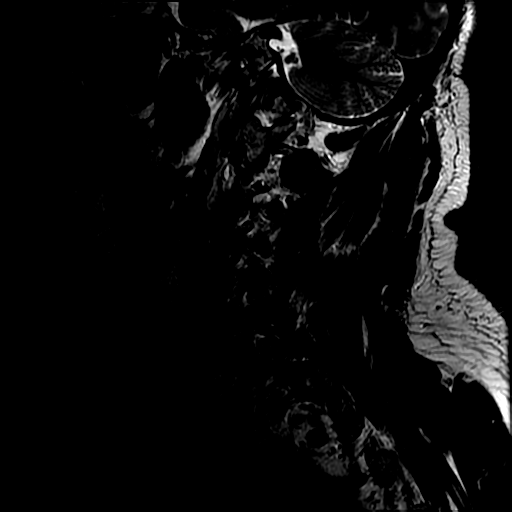

[Series 13: T1 · sagittal · 3.0mm · 0.43mm/px · 1 of 14 slices shown (2 of 2)]
[im 1/14]
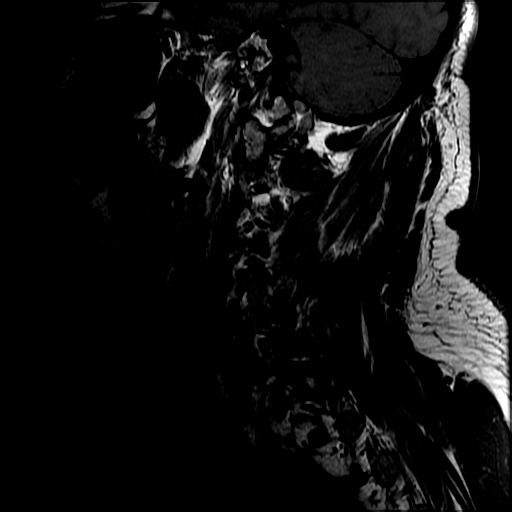

[Series 16: T2 · axial · 3.0mm · 0.39mm/px · z∈[-253,-155]mm · 2 of 30 slices shown (4 of 4)]
[im 1/30]
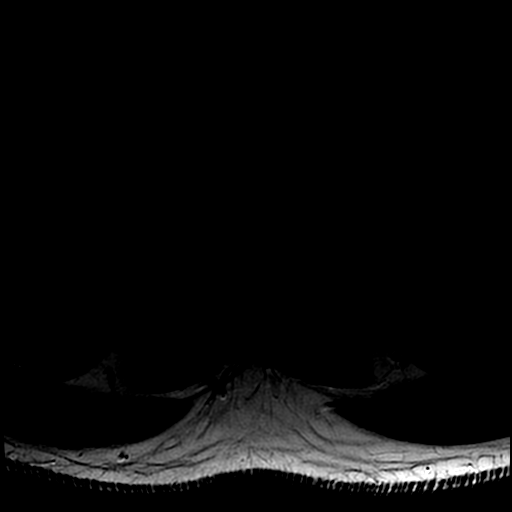
[im 30/30]
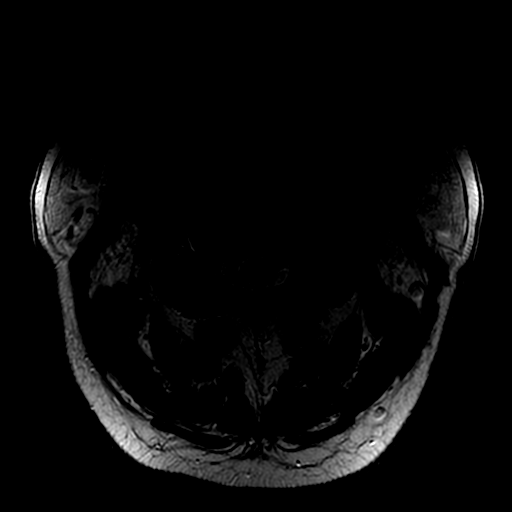

[27 of 48 positions shown; findings below may reference images not displayed]

FINDINGS: MRI HEAD FINDINGS

Brain: Cerebral volume within normal limits for age. No significant
cerebral white matter changes. No abnormal foci of restricted
diffusion to suggest acute or subacute ischemia. Gray-white matter
differentiation well maintained. No encephalomalacia to suggest
chronic infarction. No foci of susceptibility artifact to suggest
acute or chronic intracranial hemorrhage.

No mass lesion, midline shift or mass effect. No hydrocephalus. No
extra-axial fluid collection. Major dural sinuses are patent.

Two Aggelikh gland suprasellar region normal. Midline structures
intact and normal.

Vascular: Major intracranial vascular flow voids are maintained.

Skull and upper cervical spine: Craniocervical junction normal. Bone
marrow signal intensity within normal limits. No scalp soft tissue
abnormality.

Sinuses/Orbits: Globes and orbital soft tissues within normal
limits. Patient status post lens extraction bilaterally. Axial
myopia noted on the right. Mild scattered mucosal thickening within
the ethmoidal air cells and maxillary sinuses. Paranasal sinuses are
otherwise clear. Trace left mastoid effusion. Inner ear structures
normal.

Other: None.

MRI CERVICAL SPINE FINDINGS

Alignment: Vertebral bodies normally aligned with preservation of
the normal cervical lordosis. No listhesis.

Vertebrae: Vertebral body heights maintained without evidence for
acute or chronic fracture. Bone marrow signal intensity within
normal limits. No discrete or worrisome osseous lesions. No abnormal
marrow edema.

Cord: Signal intensity within the cervical spinal cord is normal.

Posterior Fossa, vertebral arteries, paraspinal tissues: Paraspinous
and prevertebral soft tissues within normal limits. Normal
intravascular flow voids present within the vertebral arteries
bilaterally.

Disc levels:

C2-C3: Shallow right paracentral disc osteophyte complex flattens
and partially effaces the right ventral thecal sac. Mild bilateral
uncovertebral hypertrophy. No significant spinal stenosis. Mild
right C3 foraminal narrowing.

C3-C4: Diffuse degenerative disc osteophyte complex with bilateral
uncovertebral spurring, left slightly worse than right. Broad
posterior component flattens and partially faces the ventral thecal
sac results in mild spinal stenosis. Severe left with moderate right
C4 foraminal stenosis.

C4-C5: Diffuse disc bulge with mild bilateral uncovertebral
hypertrophy. Mild left-sided facet hypertrophy. No significant
spinal stenosis. Mild bilateral C5 foraminal stenosis, left slightly
worse than right.

C5-C6: Mild diffuse disc bulge with bilateral uncovertebral
hypertrophy. Left-sided facet hypertrophy. Small perineural cyst
noted at the left neural foramen. Moderate bilateral C6 foraminal
stenosis. Posterior disc bulge mildly flattens the ventral thecal
sac without significant canal narrowing.

C6-C7: Mild diffuse disc bulge with bilateral uncovertebral
hypertrophy. No significant canal stenosis. Mild left C7 foraminal
narrowing. No significant right foraminal encroachment.

C7-T1:  Unremarkable.

Visualized upper thoracic spine within normal limits.
IMPRESSION: MRI HEAD IMPRESSION:

Normal MRI of the brain. No findings to explain patient's symptoms
identified.

MRI CERVICAL SPINE IMPRESSION:

1. Mild-to-moderate multilevel cervical spondylolysis with resultant
mild spinal stenosis at the C3-4 level. No other significant canal
narrowing.
2. Multifactorial degenerative changes with resultant multilevel
foraminal narrowing as above. Notable findings include severe left
with moderate right C4 foraminal narrowing with moderate bilateral
C6 foraminal stenosis.

## 2019-11-06 ENCOUNTER — Ambulatory Visit (HOSPITAL_COMMUNITY): Payer: Managed Care, Other (non HMO) | Attending: Cardiology

## 2019-11-06 ENCOUNTER — Other Ambulatory Visit: Payer: Self-pay

## 2019-11-06 DIAGNOSIS — I2510 Atherosclerotic heart disease of native coronary artery without angina pectoris: Secondary | ICD-10-CM | POA: Diagnosis present

## 2019-11-06 DIAGNOSIS — R0600 Dyspnea, unspecified: Secondary | ICD-10-CM | POA: Insufficient documentation

## 2019-11-06 DIAGNOSIS — Z9861 Coronary angioplasty status: Secondary | ICD-10-CM | POA: Diagnosis present

## 2019-11-06 LAB — ECHOCARDIOGRAM COMPLETE
Area-P 1/2: 3.48 cm2
S' Lateral: 3.6 cm

## 2019-12-25 ENCOUNTER — Other Ambulatory Visit: Payer: Self-pay

## 2019-12-25 ENCOUNTER — Encounter: Payer: Self-pay | Admitting: Internal Medicine

## 2019-12-25 ENCOUNTER — Ambulatory Visit (INDEPENDENT_AMBULATORY_CARE_PROVIDER_SITE_OTHER): Payer: Managed Care, Other (non HMO) | Admitting: Internal Medicine

## 2019-12-25 VITALS — BP 136/72 | HR 83 | Ht 70.0 in | Wt 273.0 lb

## 2019-12-25 DIAGNOSIS — Z9989 Dependence on other enabling machines and devices: Secondary | ICD-10-CM

## 2019-12-25 DIAGNOSIS — I2583 Coronary atherosclerosis due to lipid rich plaque: Secondary | ICD-10-CM

## 2019-12-25 DIAGNOSIS — Z9861 Coronary angioplasty status: Secondary | ICD-10-CM | POA: Diagnosis not present

## 2019-12-25 DIAGNOSIS — R0600 Dyspnea, unspecified: Secondary | ICD-10-CM | POA: Diagnosis not present

## 2019-12-25 DIAGNOSIS — G4733 Obstructive sleep apnea (adult) (pediatric): Secondary | ICD-10-CM | POA: Diagnosis not present

## 2019-12-25 DIAGNOSIS — I2510 Atherosclerotic heart disease of native coronary artery without angina pectoris: Secondary | ICD-10-CM | POA: Diagnosis not present

## 2019-12-25 NOTE — Patient Instructions (Signed)
Medication Instructions:  Your physician recommends that you continue on your current medications as directed. Please refer to the Current Medication list given to you today.  *If you need a refill on your cardiac medications before your next appointment, please call your pharmacy*  Follow-Up: At [facility], you and your health needs are our priority.  As part of our continuing mission to provide you with exceptional heart care, we have created designated Provider Care Teams.  These Care Teams include your primary Cardiologist (physician) and Advanced Practice Providers (APPs -  Physician Assistants and Nurse Practitioners) who all work together to provide you with the care you need, when you need it.  We recommend signing up for the patient portal called "MyChart".  Sign up information is provided on this After Visit Summary.  MyChart is used to connect with patients for Virtual Visits (Telemedicine).  Patients are able to view lab/test results, encounter notes, upcoming appointments, etc.  Non-urgent messages can be sent to your provider as well.   To learn more about what you can do with MyChart, go to [email or web address].    Your next appointment:   6 month(s)  The format for your next appointment:   In Person  Provider:   K. Mali Hilty, MD   Other Instructions

## 2019-12-25 NOTE — Progress Notes (Signed)
OFFICE NOTE  Chief Complaint:  Follow-up studies  Primary Care Physician: Druscilla Brownie, PA-C  HPI:  Thomas Walsh is a 64 y.o. male who was referred from another patient of mine who is his brother-in-law. His primary physician is [name], MD and he sees Dr. Marcello Moores of [facility] in [place], [place]. Thomas Walsh has a very complex cardiac history. He was noted to have had a left heart catheterization at [facility] in 2000 for profound symptomatic bradycardia with heart rate of 30s related to timolol eyedrops, but was found to have no obstructive coronary disease at the time. There is a family history of premature coronary artery disease Apparently in late 2015 he saw Dr. Marcello Moores in the office because of chest discomfort. He had described that as "poking, pressure and squeezing which awoke him from sleep". He was scheduled to have a stress test however during the EKG portion of the stress test there were changes suggestive of ischemia and the test was discontinued. Interpretation of that nuclear stress test was "myocardial perfusion imaging is positive for inducible ischemia. There are regions of reversible ischemia located in the inferior wall of the heart. Overall left ventricular systolic function is normal without regional wall motion abnormalities noted above." He was then going to be scheduled for elective heart catheterization however he presented to the emergency department due to continued chest pain. He underwent left heart catheterization on 03/07/2014. 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 After the procedure however he continued to have chest pain symptoms. He was placed on aspirin and Effient and started on low-dose calcium channel blocker which  caused low blood pressure and was discontinued. Cardiac markers were noted to be mildly elevated the next Walsh at 0.41 troponin which was previously negative on 03/06/2014. LDL at that time was 52. He was then admitted to [facility] in [place], [place] for chest pain in January 2016. He also complained of palpitations and was found to have an ectopic atrial rhythm with normal ventricular response. He claims that he had had significant tachycardia and was thought to have had an SVT. He reports he subsequently presented and required treatment for an SVT on another occasion when he presented to the hospital around [place], [place]. He said that he received adenosine 6 mg and then 12 mg and ultimately converted to sinus. He then was reportedly referred to Dr. Nyra Market in [facility] who scheduled him for ablation. Apparently afterwards he reports that the ablation was not totally successful however a pacemaker was required to be placed - this leads me to believe that there may have been AV nodal damage at the time. 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 The pacemaker is by his report a Medtronic device and he does have a home monitoring. He said most recently he was 94% paced. He has had progressive antianginal regimens including Ranexa and topical nitrate. 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 He underwent his last cardiac catheterization in September 2017 which demonstrated only 30% stenosis at the distal edge of the mid LAD stent. FFR was performed which was 0.89. Apparently he was continuing to have recurrent arrhythmias. Thomas Walsh also has had an opinion from a cardiologist at  [facility]. I cannot find records in care everywhere to support this however he does have some paperwork indicating he had an appointment. He does not feel that that appointment was helpful. He did bring a CD with images of his echo and cath which I  will review. His main complaint today is continued chest pain.   04/03/2016  Thomas Walsh returns today for follow-up. I was able to review his extensive records and understand he is history including placement of a Medtronic advise a MRI safe pacemaker on 10/18/2014 for sick sinus syndrome. Subsequently he developed some PAF but reportedly has a very low burden of this. 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 Recently he saw Dr. Lovena Le and was not felt to be in heart block or to be pacer dependent. Because of his dyspnea he was scheduled for a cardiopulmonary exercise test. Thomas Walsh did undergo that study which indicated no significant circulatory limitation to exercise. His peak V02 was 103%. Pulmonary function was within normal limits. It was felt that likely his symptoms were related to obesity. These are preliminary results pending a cardiology over read, but I agree with those findings. He says that he said much less angina since adjusting his medications. He is now on imdur 60 mg daily as compared to his nitroglycerin patch. Although he has some mild headaches he says that he has not needed any short acting nitroglycerin in the last 3 weeks. He is also on an increased dose of metoprolol, namely 100 mg daily. He is generally pleased with his improvement in symptoms.  10/05/2016  Thomas Walsh was seen today in follow-up. He reports that over the past several weeks she's had more chest pain. He describes this as sharp and knifelike in the anterior left chest. It's not necessarily worse with exertion, in fact it's worse at night when he lays down after going to sleep. He's been more fatigued and gets short of breath somewhat easier. He had a stress test recently which was nonischemic (this was a cardiopulmonary exercise test, but was noted to be some maximal. It was felt that his limitation exercise was due to obesity. He  reports responsiveness to nitrates recently and says that he ran out of his isosorbide which caused him more chest pain but that it improved when he restarted it, however despite that he continues to have pain and requires short acting nitroglycerin for some relief. He says that some of his symptoms feel like his angina in the past. He's been established in our pacemaker clinic and had a check in December with Dr. Lovena Le however wishes to switch his follow-up to [facility].  11/18/2016  Thomas Walsh was seen today in follow-up. Over the past month he had recurrent anginal symptoms despite increasing his isosorbide and wish to have heart catheterization. He underwent left heart catheterization by Dr. Martinique on 10/20/2016 which showed a 20% mid-LAD stenosis normal LV systolic function, normal LVEDP and a patent stent. Despite this he continued to have chest discomfort and was started on low-dose amlodipine 2.5 mg. Initially note some relief in this and does report that his blood pressure is much better controlled however he still having symptoms. He said yesterday he felt horrible with a symptoms including dizziness, nausea, imbalance, a posterior neck base headache and pain that radiated down the left arm. He's also had some gait difficulty and reports recently having some word finding difficulty.  This concerns me of about a possible neurologic cause of his symptoms and perhaps is pain is a neuropathic or chest wall pain. At this point it's unlikely this is anginal pain.  02/16/2017  Thomas Walsh returns today for follow-up.  He reports he is done well over the past several months.  He denies any new anginal symptoms.  He is not needed any short acting nitroglycerin, but is in need of a refill.  Blood pressure is well controlled today.  He denies any palpitations.  He did follow-up with the neurologist Dr. Delice Lesch, who recommended neurocognitive testing and an MRI.  Unfortunately has not been scheduled.  I have asked them  to reach out to neurology to see why this is been delayed.  He has a follow-up with her in March.  He is also scheduled to have a pacemaker check with Dr. Loletha Grayer on December 12.  08/04/2017  Thomas Walsh returns today for follow-up.  He was seen in March by Jory Sims, DNP -he had reported some chest discomfort was noted to be hypertensive.  She recommended adjustments on his medications including increasing amlodipine to 5 mg.  He reports improvement of his blood pressure and symptoms without adjustment.  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  He does seem to have had salvage of his eyesight in the right eye.  01/03/2018  Thomas Walsh is seen today in follow-up.  Recently he saw Kerin Ransom, PA-C for concerns of high blood pressure and being symptomatic.  An echocardiogram was ordered which showed diastolic dysfunction and normal systolic function.  Blood pressure was normal that Walsh and is normal again today however her readings at home do indicate a trend of elevated blood pressures.  We had increased amlodipine earlier on in the year.  He denies any cardiac chest pain.  02/11/2018  Thomas Walsh is seen today follow-up for blood pressure.  He notes his blood pressure is now better controlled on high-dose amlodipine however he has had worsening lower extremity edema.  Blood pressure is now between 354 and 656 systolic.  He is wearing compression stockings.  Also he says he had a recent lipid profile which was significantly abnormal from his PCP indicating triglycerides in the 200s and LDL of just over 100.  This is on high intensity atorvastatin and ezetimibe.  There was discussion for starting a PCSK9 inhibitor.  I agree with this as his goal LDL is less than 70.  We will go ahead and try to obtain records from his PCP and work with him to try to get his numbers down.  Ultimately could be a candidate for Vascepa.  With regards to his edema, he may benefit from further  blood pressure reduction with a diuretic.  10/16/2019  Thomas Walsh returns for follow-up.  Its been about a year and a half since I saw him.  He managed to do fairly well during the pandemic however recently has had some worsening shortness of breath and edema.  His last echo was in 2019 which was unremarkable with mild diastolic dysfunction.  He has had recurrent remote pacemaker checks which have checked out.  EKG today shows sinus rhythm at 65 without any new ischemic changes.  Blood pressure is well controlled.  Oxygen saturation was a little low at 93%.  12/25/2019  Thomas Walsh returns today for follow-up.  He underwent a repeat echo for dyspnea on exertion.  This was stable showing normal systolic function with no  significant diastolic dysfunction or elevated filling pressures.  Metabolic profile was stable.  BNP was low at 11 indicating no evidence of any heart failure.  He still reports some dyspnea.  Although there is been some weight gain he said this was present before that.  He was vaccinated for Covid and then did get Covid but he said he had some cold symptoms, nasal congestion and cough but does not attribute his shortness of breath to that.  PMHx:  Past Medical History:  Diagnosis Date  . Asthma   . 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 Coronary artery disease involving native coronary artery of native heart with unstable angina pectoris ([place]) 02/28/2016  . Essential hypertension 02/28/2016  . GERD (gastroesophageal reflux disease)   . HLD (hyperlipidemia)   . Intraocular pressure increase, right   . 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 OSA treated with BiPAP   . PAF (paroxysmal atrial fibrillation) ([place]) 02/28/2016  . PCO (posterior capsular opacification)   . Presence of permanent cardiac pacemaker   . Pseudophakia of both eyes   . S/P bilateral cataract extraction 11/21/2013  . S/P placement of cardiac pacemaker 02/28/2016   Medtronic  . S/P primary angioplasty  with coronary stent 02/28/2016  . Secondary corneal edema of left eye   . Status post corneal transplant   . Thyroid disease     Past Surgical History:  Procedure Laterality Date  . AIR/FLUID EXCHANGE Right 07/30/2017   Procedure: AIR/FLUID EXCHANGE;  Surgeon: Jalene Mullet, MD;  Location: [facility];  Service: Ophthalmology;  Laterality: Right;  . APPENDECTOMY    . AQUEOUS SHUNT EXTRAOCULAR Left 09/18/2013   Elenore Rota L. Pennie Banter, MD [facility]  . CARDIAC CATHETERIZATION  03/07/2014  . CATARACT EXTRACTION Bilateral   . CATARACT EXTRACTION W/ INTRAOCULAR LENS IMPLANT Left 01/2012   [place], MD  . CATARACT EXTRACTION W/ INTRAOCULAR LENS IMPLANT Right 2003   [place]  . CORNEAL TRANSPLANT Left 01/17/2015   Keratoplasty, Endothelial. Nelma Rothman, MD [place]   . DESCEMETS STRIPPING AUTOMATED ENDOTHELIAL KERATOPLASTY Left 04/2012   Marilynne Halsted, MD;  . GLAUCOMA SURGERY Left 09/18/2013   [place] BG-101 implant with cornea graft  . LEFT HEART CATH AND CORONARY ANGIOGRAPHY N/A 10/20/2016   Procedure: Left Heart Cath and Coronary Angiography;  Surgeon: Martinique, Peter M, MD;  Location: [facility];  Service: Cardiovascular;  Laterality: N/A;  . PACEMAKER INSERTION  09/2014   Done at [facility].   Marland Kitchen PARS PLANA VITRECTOMY Right 07/14/2017   Procedure: RETINAL DETACHMENT REPAIR RIGHT EYE TWENTY-FIVE GAUGE VITRECTOMY WITH ENDOLASER AND GAS;  Surgeon: Jalene Mullet, MD;  Location: [facility];  Service: Ophthalmology;  Laterality: Right;  . SCLERAL REINFORCEMENT Left 09/18/2013   with graft, Edson Snowball, MD, [facility]  . WISDOM TOOTH EXTRACTION      FAMHx:  Family History  Problem Relation Age of Onset  . Colon cancer Mother   . Hypertension Mother   . Kidney disease Father   . CAD Father   . Stroke Father   . Spina bifida Sister   . Thyroid disease Sister   . Diabetes Brother   . Retinal detachment Brother     SOCHx:   reports that he has never smoked. He has never used  smokeless tobacco. He reports current alcohol use of about 4.0 standard drinks of alcohol per week. He reports that he does not use drugs.  ALLERGIES:  Allergies  Allergen Reactions  . Beta Adrenergic Blockers  Anaphylaxis and Other (See Comments)    Timolol caused the patient's heart to STOP  . Other Other (See Comments)    Beta Blockers-Timolol caused black outs and the patient's heart stopped!!  . Timolol Anaphylaxis and Other (See Comments)    Cardiac stand still/stops heart  Pt reports hx severe bradycardia after long term use of timolol Cardiac stand still/Stops heart     ROS: Pertinent items noted in HPI and remainder of comprehensive ROS otherwise negative.  HOME MEDS: Current Outpatient Medications on File Prior to Visit  Medication Sig Dispense Refill  . acetaZOLAMIDE (DIAMOX) 500 MG capsule Take 1 capsule by mouth 2 (two) times daily.  0  . amLODipine (NORVASC) 10 MG tablet Take 1 tablet (10 mg total) by mouth daily. 90 tablet 1  . aspirin EC 81 MG tablet Take 1 tablet (81 mg total) by mouth daily. 90 tablet 3  . atorvastatin (LIPITOR) 40 MG tablet Take 1 tablet (40 mg total) by mouth at bedtime. 90 tablet 1  . AZOPT 1 % ophthalmic suspension Place 1 drop into the right eye 4 (four) times daily.  1  . Brinzolamide-Brimonidine (SIMBRINZA) 1-0.2 % SUSP Place 1 drop into the right eye 2 (two) times daily.     . cloNIDine (CATAPRES) 0.1 MG tablet Take 0.1 mg by mouth See admin instructions. Take 0.1 mg by mouth if systolic b/p reading is 734 or greater  2  . ezetimibe (ZETIA) 10 MG tablet Take 1 tablet (10 mg total) by mouth at bedtime. 90 tablet 1  . gabapentin (NEURONTIN) 300 MG capsule Take 900 mg by mouth daily.  5  . insulin aspart (NOVOLOG) 100 UNIT/ML injection Inject 60-70 Units into the skin as directed.    . insulin glargine (LANTUS) 100 UNIT/ML injection Inject 45 Units into the skin at bedtime.    . isosorbide mononitrate (IMDUR) 60 MG 24 hr tablet Take 1.5 tablets  (90 mg total) by mouth daily. TAKE 1 TABLET  IN THEMORNING AND 1/2 TABLET  IN THE EVENING. Please schedule appointment for refills. 45 tablet 0  . levothyroxine (SYNTHROID, LEVOTHROID) 125 MCG tablet Take 1 tablet by mouth daily.    Marland Kitchen liraglutide (VICTOZA) 18 MG/3ML SOPN Inject 0.3 mLs (1.8 mg total) into the skin daily.    . metoprolol succinate (TOPROL-XL) 50 MG 24 hr tablet Take 1 tablet (50 mg total) by mouth 2 (two) times daily. Please schedule appointment for refills. 60 tablet 0  . nitroGLYCERIN (NITROSTAT) 0.4 MG SL tablet Place 1 tablet (0.4 mg total) under the tongue every 5 (five) minutes as needed for chest pain. 75 tablet 1  . ofloxacin (OCUFLOX) 0.3 % ophthalmic solution Place 1 drop into the right eye 4 (four) times daily.  0  . omeprazole (PRILOSEC) 40 MG capsule Take 1 capsule (40 mg total) by mouth 2 (two) times daily. 180 capsule 1  . pilocarpine (PILOCAR) 2 % ophthalmic solution Place 1 drop into the right eye 4 (four) times daily.    . prasugrel (EFFIENT) 10 MG TABS tablet Take 1 tablet (10 mg total) by mouth daily. 90 tablet 2  . prednisoLONE acetate (PRED FORTE) 1 % ophthalmic suspension Place 1 drop into the left eye 3 (three) times daily.    Marland Kitchen PRESCRIPTION MEDICATION BiPAP: At bedtime and during naps    . ranolazine (RANEXA) 1000 MG SR tablet Take 1 tablet (1,000 mg total) by mouth 2 (two) times daily. 180 tablet 1  . testosterone cypionate (DEPO-TESTOSTERONE) 200 MG/ML  injection Inject 200 mg into the muscle every 14 (fourteen) days.    . chlorthalidone (HYGROTON) 25 MG tablet Take 0.5 tablets (12.5 mg total) by mouth daily. 45 tablet 1   No current facility-administered medications on file prior to visit.    LABS/IMAGING: No results found for this or any previous visit (from the past 48 hour(s)). No results found.  WEIGHTS: Wt Readings from Last 3 Encounters:  12/25/19 273 lb (123.8 kg)  10/16/19 269 lb (122 kg)  02/11/18 267 lb (121.1 kg)    VITALS: BP 136/72    Pulse 83   Ht 5\' 10"  (1.778 m)   Wt 273 lb (123.8 kg)   BMI 39.17 kg/m   EXAM: Deferred  EKG: Deferred  ASSESSMENT: 1. Progressive dyspnea and edema -get a BMP, LVEF 60 to 65% with normal wall motion 2. Chronic chest pain, dizziness, headache, word finding difficulty, left arm pain 3. Coronary artery disease with persistent angina 4. Myocardial bridge status post PCI in 02/2014 with a Promus Premier DES 5. History of SVT with ablation and need for subsequent PPM 6. S/p PPM (Medtronic) 7. Dyslipidemia 8. Hypertension 9. OSA on BIPAP  PLAN: 1.   Thomas Walsh seems to have stable dyspnea however cannot explain from a cardiac standpoint.  He denies any frequent angina.  BNP was low.  LVEF is normal.  He struggled with his BiPAP which was recalled and is now waiting for replacement.  No changes to his medicines today.  Follow-up with me in 6 months.   Pixie Casino, MD, [facility], [place] Director of the Advanced Lipid Disorders &  Cardiovascular Risk Reduction Clinic Attending Cardiologist  Direct Dial: [phone]  Fax: [phone]  Website:  [email or web address] 12/25/2019, 9:39 AM

## 2020-01-01 ENCOUNTER — Other Ambulatory Visit: Payer: Self-pay

## 2020-01-01 DIAGNOSIS — I25119 Atherosclerotic heart disease of native coronary artery with unspecified angina pectoris: Secondary | ICD-10-CM

## 2020-01-02 MED ORDER — ISOSORBIDE MONONITRATE ER 60 MG PO TB24: 90.0000 mg | ORAL_TABLET | Freq: Every day | ORAL | 0 refills | Status: DC

## 2020-01-30 ENCOUNTER — Other Ambulatory Visit: Payer: Self-pay | Admitting: Internal Medicine

## 2020-01-30 DIAGNOSIS — I25119 Atherosclerotic heart disease of native coronary artery with unspecified angina pectoris: Secondary | ICD-10-CM

## 2020-03-02 ENCOUNTER — Other Ambulatory Visit: Payer: Self-pay | Admitting: Internal Medicine

## 2020-03-02 DIAGNOSIS — I25119 Atherosclerotic heart disease of native coronary artery with unspecified angina pectoris: Secondary | ICD-10-CM

## 2020-05-27 ENCOUNTER — Other Ambulatory Visit: Payer: Self-pay

## 2020-05-27 ENCOUNTER — Encounter: Payer: Self-pay | Admitting: Internal Medicine

## 2020-05-27 ENCOUNTER — Ambulatory Visit (INDEPENDENT_AMBULATORY_CARE_PROVIDER_SITE_OTHER): Payer: Medicare HMO | Admitting: Internal Medicine

## 2020-05-27 VITALS — BP 125/75 | HR 75 | Ht 70.0 in | Wt 268.2 lb

## 2020-05-27 DIAGNOSIS — Z9861 Coronary angioplasty status: Secondary | ICD-10-CM | POA: Diagnosis not present

## 2020-05-27 DIAGNOSIS — I2510 Atherosclerotic heart disease of native coronary artery without angina pectoris: Secondary | ICD-10-CM | POA: Diagnosis not present

## 2020-05-27 DIAGNOSIS — I495 Sick sinus syndrome: Secondary | ICD-10-CM

## 2020-05-27 DIAGNOSIS — R0600 Dyspnea, unspecified: Secondary | ICD-10-CM

## 2020-05-27 DIAGNOSIS — G4733 Obstructive sleep apnea (adult) (pediatric): Secondary | ICD-10-CM | POA: Diagnosis not present

## 2020-05-27 DIAGNOSIS — Z950 Presence of cardiac pacemaker: Secondary | ICD-10-CM

## 2020-05-27 MED ORDER — PRASUGREL HCL 10 MG PO TABS: 10.0000 mg | ORAL_TABLET | Freq: Every day | ORAL | 3 refills | Status: DC

## 2020-05-27 NOTE — Patient Instructions (Signed)

## 2020-05-27 NOTE — Progress Notes (Signed)
OFFICE NOTE  Chief Complaint:  Follow-up  Primary Care Physician: Druscilla Brownie, PA-C  HPI:  Thomas Walsh is a 65 y.o. male who was referred from another patient of mine who is his brother-in-law. His primary physician is [name], MD and he sees Dr. Marcello Moores of [facility] in [place], [place]. Thomas Walsh has a very complex cardiac history. He was noted to have had a left heart catheterization at [facility] in 2000 for profound symptomatic bradycardia with heart rate of 30s related to timolol eyedrops, but was found to have no obstructive coronary disease at the time. There is a family history of premature coronary artery disease Apparently in late 2015 he saw Dr. Marcello Moores in the office because of chest discomfort. He had described that as "poking, pressure and squeezing which awoke him from sleep". He was scheduled to have a stress test however during the EKG portion of the stress test there were changes suggestive of ischemia and the test was discontinued. Interpretation of that nuclear stress test was "myocardial perfusion imaging is positive for inducible ischemia. There are regions of reversible ischemia located in the inferior wall of the heart. Overall left ventricular systolic function is normal without regional wall motion abnormalities noted above." He was then going to be scheduled for elective heart catheterization however he presented to the emergency department due to continued chest pain. He underwent left heart catheterization on 03/07/2014. 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 After the procedure however he continued to have chest pain symptoms. He was placed on aspirin and Effient and started on low-dose calcium channel blocker which caused  low blood pressure and was discontinued. Cardiac markers were noted to be mildly elevated the next day at 0.41 troponin which was previously negative on 03/06/2014. LDL at that time was 52. He was then admitted to [facility] in [place], [place] for chest pain in January 2016. He also complained of palpitations and was found to have an ectopic atrial rhythm with normal ventricular response. He claims that he had had significant tachycardia and was thought to have had an SVT. He reports he subsequently presented and required treatment for an SVT on another occasion when he presented to the hospital around [place], [place]. He said that he received adenosine 6 mg and then 12 mg and ultimately converted to sinus. He then was reportedly referred to Dr. Nyra Market in [facility] who scheduled him for ablation. Apparently afterwards he reports that the ablation was not totally successful however a pacemaker was required to be placed - this leads me to believe that there may have been AV nodal damage at the time. 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 The pacemaker is by his report a Medtronic device and he does have a home monitoring. He said most recently he was 94% paced. He has had progressive antianginal regimens including Ranexa and topical nitrate. 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 He underwent his last cardiac catheterization in September 2017 which demonstrated only 30% stenosis at the distal edge of the mid LAD stent. FFR was performed which was 0.89. Apparently he was continuing to have recurrent arrhythmias. Thomas Walsh also has had an opinion from a cardiologist at [facility].  I cannot find records in care everywhere to support this however he does have some paperwork indicating he had an appointment. He does not feel that that appointment was helpful. He did bring a CD with images of his echo and cath which I will  review. His main complaint today is continued chest pain.   04/03/2016  Thomas Walsh returns today for follow-up. I was able to review his extensive records and understand he is history including placement of a Medtronic advise a MRI safe pacemaker on 10/18/2014 for sick sinus syndrome. Subsequently he developed some PAF but reportedly has a very low burden of this. 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 Recently he saw Dr. Lovena Le and was not felt to be in heart block or to be pacer dependent. Because of his dyspnea he was scheduled for a cardiopulmonary exercise test. Thomas Walsh did undergo that study which indicated no significant circulatory limitation to exercise. His peak V02 was 103%. Pulmonary function was within normal limits. It was felt that likely his symptoms were related to obesity. These are preliminary results pending a cardiology over read, but I agree with those findings. He says that he said much less angina since adjusting his medications. He is now on imdur 60 mg daily as compared to his nitroglycerin patch. Although he has some mild headaches he says that he has not needed any short acting nitroglycerin in the last 3 weeks. He is also on an increased dose of metoprolol, namely 100 mg daily. He is generally pleased with his improvement in symptoms.  10/05/2016  Thomas Walsh was seen today in follow-up. He reports that over the past several weeks she's had more chest pain. He describes this as sharp and knifelike in the anterior left chest. It's not necessarily worse with exertion, in fact it's worse at night when he lays down after going to sleep. He's been more fatigued and gets short of breath somewhat easier. He had a stress test recently which was nonischemic (this was a cardiopulmonary exercise test, but was noted to be some maximal. It was felt that his limitation exercise was due to obesity. He  reports responsiveness to nitrates recently and says that he ran out of his isosorbide which caused him more chest pain but that it improved when he restarted it, however despite that he continues to have pain and requires short acting nitroglycerin for some relief. He says that some of his symptoms feel like his angina in the past. He's been established in our pacemaker clinic and had a check in December with Dr. Lovena Le however wishes to switch his follow-up to [facility].  11/18/2016  Thomas Walsh was seen today in follow-up. Over the past month he had recurrent anginal symptoms despite increasing his isosorbide and wish to have heart catheterization. He underwent left heart catheterization by Dr. Martinique on 10/20/2016 which showed a 20% mid-LAD stenosis normal LV systolic function, normal LVEDP and a patent stent. Despite this he continued to have chest discomfort and was started on low-dose amlodipine 2.5 mg. Initially note some relief in this and does report that his blood pressure is much better controlled however he still having symptoms. He said yesterday he felt horrible with a symptoms including dizziness, nausea, imbalance, a posterior neck base headache and pain that radiated down the left arm. He's also had some gait difficulty and reports recently having some word finding difficulty. This  concerns me of about a possible neurologic cause of his symptoms and perhaps is pain is a neuropathic or chest wall pain. At this point it's unlikely this is anginal pain.  02/16/2017  Thomas Walsh returns today for follow-up.  He reports he is done well over the past several months.  He denies any new anginal symptoms.  He is not needed any short acting nitroglycerin, but is in need of a refill.  Blood pressure is well controlled today.  He denies any palpitations.  He did follow-up with the neurologist Dr. Delice Lesch, who recommended neurocognitive testing and an MRI.  Unfortunately has not been scheduled.  I have asked them  to reach out to neurology to see why this is been delayed.  He has a follow-up with her in March.  He is also scheduled to have a pacemaker check with Dr. Loletha Grayer on December 12.  08/04/2017  Thomas Walsh returns today for follow-up.  He was seen in March by Jory Sims, DNP -he had reported some chest discomfort was noted to be hypertensive.  She recommended adjustments on his medications including increasing amlodipine to 5 mg.  He reports improvement of his blood pressure and symptoms without adjustment.  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  He does seem to have had salvage of his eyesight in the right eye.  01/03/2018  Thomas Walsh is seen today in follow-up.  Recently he saw Kerin Ransom, PA-C for concerns of high blood pressure and being symptomatic.  An echocardiogram was ordered which showed diastolic dysfunction and normal systolic function.  Blood pressure was normal that day and is normal again today however her readings at home do indicate a trend of elevated blood pressures.  We had increased amlodipine earlier on in the year.  He denies any cardiac chest pain.  02/11/2018  Thomas Walsh is seen today follow-up for blood pressure.  He notes his blood pressure is now better controlled on high-dose amlodipine however he has had worsening lower extremity edema.  Blood pressure is now between 299 and 371 systolic.  He is wearing compression stockings.  Also he says he had a recent lipid profile which was significantly abnormal from his PCP indicating triglycerides in the 200s and LDL of just over 100.  This is on high intensity atorvastatin and ezetimibe.  There was discussion for starting a PCSK9 inhibitor.  I agree with this as his goal LDL is less than 70.  We will go ahead and try to obtain records from his PCP and work with him to try to get his numbers down.  Ultimately could be a candidate for Vascepa.  With regards to his edema, he may benefit from further  blood pressure reduction with a diuretic.  10/16/2019  Thomas Walsh returns for follow-up.  Its been about a year and a half since I saw him.  He managed to do fairly well during the pandemic however recently has had some worsening shortness of breath and edema.  His last echo was in 2019 which was unremarkable with mild diastolic dysfunction.  He has had recurrent remote pacemaker checks which have checked out.  EKG today shows sinus rhythm at 65 without any new ischemic changes.  Blood pressure is well controlled.  Oxygen saturation was a little low at 93%.  12/25/2019  Thomas Walsh returns today for follow-up.  He underwent a repeat echo for dyspnea on exertion.  This was stable showing normal systolic function with no significant  diastolic dysfunction or elevated filling pressures.  Metabolic profile was stable.  BNP was low at 11 indicating no evidence of any heart failure.  He still reports some dyspnea.  Although there is been some weight gain he said this was present before that.  He was vaccinated for Covid and then did get Covid but he said he had some cold symptoms, nasal congestion and cough but does not attribute his shortness of breath to that.  05/27/2020  Thomas Walsh is seen today in follow-up.  Overall he seems to be fairly stable.  He had work-up for dyspnea but no evidence of a cardiac etiology.  He is due for a remote pacer check in a few weeks.  He recently said he ran out of his Effient for several weeks and could not get it represcribed through his PCP.  He has not been set up back on BiPAP because his equipment was recalled and none is available.  He gets some occasional anginal symptoms but otherwise is able to do most daily activities.  He had repeat lipids recently from his PCP which was an NMR.  This showed total LDL-P of 1107, LDL-C of 67, HDL 42 and triglycerides 210.  This does show a significant improvement compared to prior studies.  PMHx:  Past Medical History:  Diagnosis Date   . Asthma   . 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 Coronary artery disease involving native coronary artery of native heart with unstable angina pectoris ([place]) 02/28/2016  . Essential hypertension 02/28/2016  . GERD (gastroesophageal reflux disease)   . HLD (hyperlipidemia)   . Intraocular pressure increase, right   . 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 OSA treated with BiPAP   . PAF (paroxysmal atrial fibrillation) ([place]) 02/28/2016  . PCO (posterior capsular opacification)   . Presence of permanent cardiac pacemaker   . Pseudophakia of both eyes   . S/P bilateral cataract extraction 11/21/2013  . S/P placement of cardiac pacemaker 02/28/2016   Medtronic  . S/P primary angioplasty with coronary stent 02/28/2016  . Secondary corneal edema of left eye   . Status post corneal transplant   . Thyroid disease     Past Surgical History:  Procedure Laterality Date  . AIR/FLUID EXCHANGE Right 07/30/2017   Procedure: AIR/FLUID EXCHANGE;  Surgeon: Jalene Mullet, MD;  Location: [facility];  Service: Ophthalmology;  Laterality: Right;  . APPENDECTOMY    . AQUEOUS SHUNT EXTRAOCULAR Left 09/18/2013   Elenore Rota L. Pennie Banter, MD [facility]  . CARDIAC CATHETERIZATION  03/07/2014  . CATARACT EXTRACTION Bilateral   . CATARACT EXTRACTION W/ INTRAOCULAR LENS IMPLANT Left 01/2012   [place]Cu[place], MD  . CATARACT EXTRACTION W/ INTRAOCULAR LENS IMPLANT Right 2003   [facility]  . CORNEAL TRANSPLANT Left 01/17/2015   Keratoplasty, Endothelial. Nelma Rothman, MD [facility]   . DESCEMETS STRIPPING AUTOMATED ENDOTHELIAL KERATOPLASTY Left 04/2012   Marilynne Halsted, MD;  . GLAUCOMA SURGERY Left 09/18/2013   [place] BG-101 implant with cornea graft  . LEFT HEART CATH AND CORONARY ANGIOGRAPHY N/A 10/20/2016   Procedure: Left Heart Cath and Coronary Angiography;  Surgeon: Martinique, Peter M, MD;  Location: [facility];  Service: Cardiovascular;  Laterality: N/A;  . PACEMAKER INSERTION  09/2014   Done at  [facility].   Marland Kitchen PARS PLANA VITRECTOMY Right 07/14/2017   Procedure: RETINAL DETACHMENT REPAIR RIGHT EYE TWENTY-FIVE GAUGE VITRECTOMY WITH ENDOLASER AND GAS;  Surgeon: Jalene Mullet, MD;  Location: [facility];  Service: Ophthalmology;  Laterality: Right;  . SCLERAL REINFORCEMENT Left 09/18/2013   with graft, Edson Snowball, MD, [facility]  . WISDOM TOOTH EXTRACTION      FAMHx:  Family History  Problem Relation Age of Onset  . Colon cancer Mother   . Hypertension Mother   . Kidney disease Father   . CAD Father   . Stroke Father   . Spina bifida Sister   . Thyroid disease Sister   . Diabetes Brother   . Retinal detachment Brother     SOCHx:   reports that he has never smoked. He has never used smokeless tobacco. He reports current alcohol use of about 4.0 standard drinks of alcohol per week. He reports that he does not use drugs.  ALLERGIES:  Allergies  Allergen Reactions  . Beta Adrenergic Blockers Anaphylaxis and Other (See Comments)    Timolol caused the patient's heart to STOP  . Other Other (See Comments)    Beta Blockers-Timolol caused black outs and the patient's heart stopped!!  . Timolol Anaphylaxis and Other (See Comments)    Cardiac stand still/stops heart  Pt reports hx severe bradycardia after long term use of timolol Cardiac stand still/Stops heart     ROS: Pertinent items noted in HPI and remainder of comprehensive ROS otherwise negative.  HOME MEDS: Current Outpatient Medications on File Prior to Visit  Medication Sig Dispense Refill  . acetaZOLAMIDE (DIAMOX) 500 MG capsule Take 1 capsule by mouth 2 (two) times daily.  0  . amLODipine (NORVASC) 10 MG tablet Take 1 tablet (10 mg total) by mouth daily. 90 tablet 1  . aspirin EC 81 MG tablet Take 1 tablet (81 mg total) by mouth daily. 90 tablet 3  . atorvastatin (LIPITOR) 40 MG tablet Take 1 tablet (40 mg total) by mouth at bedtime. 90 tablet 1  . AZOPT 1 % ophthalmic suspension Place 1 drop into the right eye 4 (four)  times daily.  1  . Brinzolamide-Brimonidine 1-0.2 % SUSP Place 1 drop into the right eye 2 (two) times daily.     . cloNIDine (CATAPRES) 0.1 MG tablet Take 0.1 mg by mouth See admin instructions. Take 0.1 mg by mouth if systolic b/p reading is 258 or greater  2  . ezetimibe (ZETIA) 10 MG tablet Take 1 tablet (10 mg total) by mouth at bedtime. 90 tablet 1  . gabapentin (NEURONTIN) 300 MG capsule Take 900 mg by mouth daily.  5  . insulin aspart (NOVOLOG) 100 UNIT/ML injection Inject 60-70 Units into the skin as directed.    . insulin glargine (LANTUS) 100 UNIT/ML injection Inject 45 Units into the skin at bedtime.    . isosorbide mononitrate (IMDUR) 60 MG 24 hr tablet TAKE 1 TABLET BY MOUTH IN THE MORNING AND 1/2 (ONE-HALF) IN THE EVENING ...SCHEDULE  APPT.  FOR  REFILLS 45 tablet 10  . levothyroxine (SYNTHROID, LEVOTHROID) 125 MCG tablet Take 1 tablet by mouth daily.    Marland Kitchen liraglutide (VICTOZA) 18 MG/3ML SOPN Inject 0.3 mLs (1.8 mg total) into the skin daily.    . metoprolol succinate (TOPROL-XL) 50 MG 24 hr tablet Take 1 tablet (50 mg total) by mouth 2 (two) times daily. Please schedule appointment for refills. 60 tablet 0  . nitroGLYCERIN (NITROSTAT) 0.4 MG SL tablet Place 1 tablet (0.4 mg total) under the tongue every 5 (five) minutes as needed for chest pain. 75 tablet 1  . ofloxacin (OCUFLOX) 0.3 % ophthalmic solution Place 1 drop into the right eye 4 (four)  times daily.  0  . omeprazole (PRILOSEC) 40 MG capsule Take 1 capsule (40 mg total) by mouth 2 (two) times daily. 180 capsule 1  . pilocarpine (PILOCAR) 2 % ophthalmic solution Place 1 drop into the right eye 4 (four) times daily.    . prasugrel (EFFIENT) 10 MG TABS tablet Take 1 tablet (10 mg total) by mouth daily. 90 tablet 2  . prednisoLONE acetate (PRED FORTE) 1 % ophthalmic suspension Place 1 drop into the left eye 3 (three) times daily.    Marland Kitchen PRESCRIPTION MEDICATION BiPAP: At bedtime and during naps    . ranolazine (RANEXA) 1000 MG SR  tablet Take 1 tablet (1,000 mg total) by mouth 2 (two) times daily. 180 tablet 1  . testosterone cypionate (DEPOTESTOSTERONE CYPIONATE) 200 MG/ML injection Inject 200 mg into the muscle every 14 (fourteen) days.    . chlorthalidone (HYGROTON) 25 MG tablet Take 0.5 tablets (12.5 mg total) by mouth daily. 45 tablet 1  . finasteride (PROSCAR) 5 MG tablet Take 5 mg by mouth daily.    . tamsulosin (FLOMAX) 0.4 MG CAPS capsule Take 0.4 mg by mouth daily.     No current facility-administered medications on file prior to visit.    LABS/IMAGING: No results found for this or any previous visit (from the past 48 hour(s)). No results found.  WEIGHTS: Wt Readings from Last 3 Encounters:  05/27/20 268 lb 3.2 oz (121.7 kg)  12/25/19 273 lb (123.8 kg)  10/16/19 269 lb (122 kg)    VITALS: BP 125/75   Pulse 75   Ht 5\' 10"  (1.778 m)   Wt 268 lb 3.2 oz (121.7 kg)   SpO2 95%   BMI 38.48 kg/m   EXAM: General appearance: alert, no distress and moderately obese Neck: no carotid bruit, no JVD and thyroid not enlarged, symmetric, no tenderness/mass/nodules Lungs: clear to auscultation bilaterally Heart: regular rate and rhythm Abdomen: soft, non-tender; bowel sounds normal; no masses,  no organomegaly Extremities: extremities normal, atraumatic, no cyanosis or edema Pulses: 2+ and symmetric Skin: Skin color, texture, turgor normal. No rashes or lesions Neurologic: Grossly normal : Pleasant  EKG: Normal sinus rhythm at 75, moderate voltage criteria for LVH, nonspecific T wave changes-personally reviewed  ASSESSMENT: 1. Dyspnea - LVEF 60 to 65% with normal wall motion (10/2019) 2. Chronic chest pain, dizziness, headache, word finding difficulty, left arm pain 3. Coronary artery disease with persistent angina 4. Myocardial bridge status post PCI in 02/2014 with a Promus Premier DES 5. History of SVT with ablation and need for subsequent PPM 6. S/p PPM  (Medtronic) 7. Dyslipidemia 8. Hypertension 9. OSA on BIPAP  PLAN: 1.   Thomas Walsh is to be stable with chronic stable angina.  He tries to avoid taking nitro if he can.  He is due for repeat remote checks of his pacemaker which will be later this month.  He is cholesterol is improved.  He needs to continue work on additional weight loss.  He is not currently on BiPAP due to a recall of his machine and is awaiting a replacement for that.  Plan follow-up with me in 6 months or sooner as necessary.   Pixie Casino, MD, [facility], [place] Director of the Advanced Lipid Disorders &  Cardiovascular Risk Reduction Clinic Attending Cardiologist  Direct Dial: [phone]  Fax: [phone]  Website:  [email or web address]  Nadean Corwin Hilty 05/27/2020, 9:22 AM

## 2020-07-03 ENCOUNTER — Other Ambulatory Visit (HOSPITAL_COMMUNITY): Payer: Self-pay | Admitting: Unknown Physician Specialty

## 2020-07-03 DIAGNOSIS — M542 Cervicalgia: Secondary | ICD-10-CM

## 2020-07-03 DIAGNOSIS — M5412 Radiculopathy, cervical region: Secondary | ICD-10-CM

## 2020-07-18 ENCOUNTER — Ambulatory Visit (HOSPITAL_COMMUNITY)
Admission: RE | Admit: 2020-07-18 | Discharge: 2020-07-18 | Disposition: A | Discharge status: Home / Self Care | Payer: Medicare HMO | Source: Ambulatory Visit | Attending: Student | Admitting: Student

## 2020-07-18 ENCOUNTER — Other Ambulatory Visit: Payer: Self-pay

## 2020-07-18 DIAGNOSIS — M542 Cervicalgia: Secondary | ICD-10-CM | POA: Diagnosis present

## 2020-07-18 DIAGNOSIS — Z950 Presence of cardiac pacemaker: Secondary | ICD-10-CM | POA: Insufficient documentation

## 2020-07-18 DIAGNOSIS — M5412 Radiculopathy, cervical region: Secondary | ICD-10-CM | POA: Diagnosis present

## 2020-07-18 NOTE — Progress Notes (Signed)
[facility] at [facility] for [facility] c-spine wo contrast. Has medtronic device and went for chest xray upon arrival to rule out abandoned leads. Chest xray was cleared by andy- Cards PA. [facility] sent and orders obtained for DOO 90. Will re-program once scan is completed.

## 2020-07-18 NOTE — Progress Notes (Signed)
Informed of MRI for today.   Device system confirmed to be MRI conditional, with implant date > 6 weeks ago and no evidence of abandoned or epicardial leads in review of most recent CXR Interrogation from today reviewed, pt is currently AS-VS at ~70 bpm Change device settings for MRI to DOO at 90 bpm  Tachy-therapies to off if applicable.  Program device back to pre-MRI settings after completion of exam.  Jayvin, [place]  07/18/2020 12:58 PM

## 2020-07-23 ENCOUNTER — Other Ambulatory Visit: Payer: Self-pay

## 2020-07-24 ENCOUNTER — Other Ambulatory Visit: Payer: Self-pay

## 2020-07-24 MED ORDER — RANOLAZINE ER 1000 MG PO TB12: 1000.0000 mg | ORAL_TABLET | Freq: Two times a day (BID) | ORAL | 3 refills | Status: DC

## 2020-07-24 MED ORDER — METOPROLOL SUCCINATE ER 50 MG PO TB24: 50.0000 mg | ORAL_TABLET | Freq: Two times a day (BID) | ORAL | 0 refills | Status: DC

## 2020-07-28 ENCOUNTER — Other Ambulatory Visit: Payer: Self-pay

## 2020-07-29 ENCOUNTER — Telehealth: Payer: Self-pay | Admitting: Internal Medicine

## 2020-07-29 MED ORDER — CHLORTHALIDONE 25 MG PO TABS: 12.5000 mg | ORAL_TABLET | Freq: Every day | ORAL | 1 refills | Status: AC

## 2020-07-29 MED ORDER — AMLODIPINE BESYLATE 10 MG PO TABS: 10.0000 mg | ORAL_TABLET | Freq: Every day | ORAL | 0 refills | Status: DC

## 2020-07-29 NOTE — Telephone Encounter (Signed)
Called and spoke with Mrs. Thomas Walsh who is on patient's DPR. I informed her that I refilled her husband's Rx for amlodipine (norvasc) for a 30 day supply as asked and sent to the [facility] in [place], [place]. She verbalized understanding and all (if any) questions were answered. Mrs. Thomas Walsh thanked me for calling

## 2020-07-29 NOTE — Telephone Encounter (Signed)
*  STAT* If patient is at the pharmacy, call can be transferred to refill team.   1. Which medications need to be refilled? (please list name of each medication and dose if known) amLODipine (NORVASC) 10 MG tablet  2. Which pharmacy/location (including street and city if local pharmacy) is medication to be sent to? [facility], [place] [address]  3. Do they need a 30 day or 90 day supply? 30 day supply

## 2020-08-23 ENCOUNTER — Other Ambulatory Visit: Payer: Self-pay | Admitting: Internal Medicine

## 2020-08-31 ENCOUNTER — Other Ambulatory Visit: Payer: Self-pay

## 2020-08-31 ENCOUNTER — Other Ambulatory Visit: Payer: Self-pay | Admitting: Internal Medicine

## 2020-09-16 ENCOUNTER — Ambulatory Visit (INDEPENDENT_AMBULATORY_CARE_PROVIDER_SITE_OTHER): Payer: Managed Care, Other (non HMO)

## 2020-09-16 DIAGNOSIS — I495 Sick sinus syndrome: Secondary | ICD-10-CM

## 2020-09-19 LAB — CUP PACEART REMOTE DEVICE CHECK
Battery Remaining Longevity: 65 mo
Battery Voltage: 3 V
Brady Statistic AP VP Percent: 0.04 %
Brady Statistic AP VS Percent: 26.39 %
Brady Statistic AS VP Percent: 0.02 %
Brady Statistic AS VS Percent: 73.56 %
Brady Statistic RA Percent Paced: 26.42 %
Brady Statistic RV Percent Paced: 0.06 %
Date Time Interrogation Session: 20220621102342
Implantable Lead Implant Date: 20160721
Implantable Lead Implant Date: 20160721
Implantable Lead Location: 753859
Implantable Lead Location: 753860
Implantable Lead Model: 5076
Implantable Lead Model: 5076
Implantable Pulse Generator Implant Date: 20160721
Lead Channel Impedance Value: 380 Ohm
Lead Channel Impedance Value: 475 Ohm
Lead Channel Impedance Value: 513 Ohm
Lead Channel Impedance Value: 570 Ohm
Lead Channel Pacing Threshold Amplitude: 0.625 V
Lead Channel Pacing Threshold Amplitude: 0.75 V
Lead Channel Pacing Threshold Pulse Width: 0.4 ms
Lead Channel Pacing Threshold Pulse Width: 0.4 ms
Lead Channel Sensing Intrinsic Amplitude: 3.5 mV
Lead Channel Sensing Intrinsic Amplitude: 3.5 mV
Lead Channel Sensing Intrinsic Amplitude: 4.625 mV
Lead Channel Sensing Intrinsic Amplitude: 4.625 mV
Lead Channel Setting Pacing Amplitude: 1.5 V
Lead Channel Setting Pacing Amplitude: 2 V
Lead Channel Setting Pacing Pulse Width: 0.4 ms
Lead Channel Setting Sensing Sensitivity: 0.9 mV

## 2020-09-23 ENCOUNTER — Other Ambulatory Visit: Payer: Self-pay

## 2020-09-24 MED ORDER — METOPROLOL SUCCINATE ER 50 MG PO TB24: 50.0000 mg | ORAL_TABLET | Freq: Two times a day (BID) | ORAL | 2 refills | Status: DC

## 2020-10-04 NOTE — Progress Notes (Signed)
Remote pacemaker transmission.   

## 2020-12-16 ENCOUNTER — Ambulatory Visit: Payer: Managed Care, Other (non HMO)

## 2020-12-25 LAB — CUP PACEART REMOTE DEVICE CHECK
Battery Remaining Longevity: 56 mo
Battery Voltage: 2.99 V
Brady Statistic AP VP Percent: 0.03 %
Brady Statistic AP VS Percent: 28.82 %
Brady Statistic AS VP Percent: 0.02 %
Brady Statistic AS VS Percent: 71.13 %
Brady Statistic RA Percent Paced: 28.84 %
Brady Statistic RV Percent Paced: 0.05 %
Date Time Interrogation Session: 20220923235121
Implantable Lead Implant Date: 20160721
Implantable Lead Implant Date: 20160721
Implantable Lead Location: 753859
Implantable Lead Location: 753860
Implantable Lead Model: 5076
Implantable Lead Model: 5076
Implantable Pulse Generator Implant Date: 20160721
Lead Channel Impedance Value: 361 Ohm
Lead Channel Impedance Value: 456 Ohm
Lead Channel Impedance Value: 456 Ohm
Lead Channel Impedance Value: 513 Ohm
Lead Channel Pacing Threshold Amplitude: 0.625 V
Lead Channel Pacing Threshold Amplitude: 0.75 V
Lead Channel Pacing Threshold Pulse Width: 0.4 ms
Lead Channel Pacing Threshold Pulse Width: 0.4 ms
Lead Channel Sensing Intrinsic Amplitude: 3.875 mV
Lead Channel Sensing Intrinsic Amplitude: 3.875 mV
Lead Channel Sensing Intrinsic Amplitude: 4.75 mV
Lead Channel Sensing Intrinsic Amplitude: 4.75 mV
Lead Channel Setting Pacing Amplitude: 1.5 V
Lead Channel Setting Pacing Amplitude: 2 V
Lead Channel Setting Pacing Pulse Width: 0.4 ms
Lead Channel Setting Sensing Sensitivity: 0.9 mV

## 2021-01-16 ENCOUNTER — Ambulatory Visit: Payer: Medicare HMO | Admitting: Physician Assistant

## 2021-03-29 ENCOUNTER — Other Ambulatory Visit: Payer: Self-pay | Admitting: Internal Medicine

## 2021-03-29 DIAGNOSIS — I25119 Atherosclerotic heart disease of native coronary artery with unspecified angina pectoris: Secondary | ICD-10-CM

## 2021-05-17 ENCOUNTER — Other Ambulatory Visit: Payer: Self-pay | Admitting: Internal Medicine

## 2021-05-17 DIAGNOSIS — I25119 Atherosclerotic heart disease of native coronary artery with unspecified angina pectoris: Secondary | ICD-10-CM

## 2021-05-19 MED ORDER — ISOSORBIDE MONONITRATE ER 60 MG PO TB24: ORAL_TABLET | ORAL | 0 refills | Status: DC

## 2021-05-19 NOTE — Telephone Encounter (Signed)
Medication will refilled for one one month. Patient has an appointment 06/16/21 with Dr Debara Pickett.

## 2021-05-21 ENCOUNTER — Other Ambulatory Visit: Payer: Self-pay

## 2021-05-21 ENCOUNTER — Telehealth: Payer: Self-pay | Admitting: Internal Medicine

## 2021-05-21 DIAGNOSIS — I25119 Atherosclerotic heart disease of native coronary artery with unspecified angina pectoris: Secondary | ICD-10-CM

## 2021-05-21 MED ORDER — ISOSORBIDE MONONITRATE ER 60 MG PO TB24: ORAL_TABLET | ORAL | 0 refills | Status: DC

## 2021-05-21 NOTE — Telephone Encounter (Signed)
° ° °*  STAT* If patient is at the pharmacy, call can be transferred to refill team.   1. Which medications need to be refilled? (please list name of each medication and dose if known) isosorbide mononitrate (IMDUR) 60 MG 24 hr tablet  2. Which pharmacy/location (including street and city if local pharmacy) is medication to be sent to? [facility], [place] [address]  3. Do they need a 30 day or 90 day supply? 90 days

## 2021-05-21 NOTE — Telephone Encounter (Signed)
Refill sent to the pharmacy 

## 2021-05-21 NOTE — Addendum Note (Signed)
Addended by: Venetia Maxon on: 05/21/2021 02:04 PM   Modules accepted: Orders

## 2021-05-21 NOTE — Telephone Encounter (Signed)
Refills has been sent to the pharmacy. 

## 2021-06-16 ENCOUNTER — Other Ambulatory Visit: Payer: Self-pay

## 2021-06-16 ENCOUNTER — Ambulatory Visit (INDEPENDENT_AMBULATORY_CARE_PROVIDER_SITE_OTHER): Payer: Medicare HMO | Admitting: Internal Medicine

## 2021-06-16 ENCOUNTER — Encounter: Payer: Self-pay | Admitting: Internal Medicine

## 2021-06-16 VITALS — BP 105/62 | HR 62 | Ht 70.0 in | Wt 268.0 lb

## 2021-06-16 DIAGNOSIS — I25119 Atherosclerotic heart disease of native coronary artery with unspecified angina pectoris: Secondary | ICD-10-CM | POA: Diagnosis not present

## 2021-06-16 DIAGNOSIS — Z950 Presence of cardiac pacemaker: Secondary | ICD-10-CM

## 2021-06-16 DIAGNOSIS — I2510 Atherosclerotic heart disease of native coronary artery without angina pectoris: Secondary | ICD-10-CM | POA: Diagnosis not present

## 2021-06-16 DIAGNOSIS — G4733 Obstructive sleep apnea (adult) (pediatric): Secondary | ICD-10-CM | POA: Diagnosis not present

## 2021-06-16 DIAGNOSIS — Z9861 Coronary angioplasty status: Secondary | ICD-10-CM

## 2021-06-16 MED ORDER — EZETIMIBE 10 MG PO TABS: 10.0000 mg | ORAL_TABLET | Freq: Every day | ORAL | 3 refills | Status: AC

## 2021-06-16 MED ORDER — NITROGLYCERIN 0.4 MG SL SUBL: 0.4000 mg | SUBLINGUAL_TABLET | SUBLINGUAL | 1 refills | Status: DC | PRN

## 2021-06-16 MED ORDER — AMLODIPINE BESYLATE 5 MG PO TABS: 5.0000 mg | ORAL_TABLET | Freq: Every day | ORAL | 3 refills | Status: AC

## 2021-06-16 MED ORDER — ISOSORBIDE MONONITRATE ER 60 MG PO TB24: 60.0000 mg | ORAL_TABLET | Freq: Two times a day (BID) | ORAL | 3 refills | Status: DC

## 2021-06-16 MED ORDER — PRASUGREL HCL 10 MG PO TABS: 10.0000 mg | ORAL_TABLET | Freq: Every day | ORAL | 3 refills | Status: DC

## 2021-06-16 NOTE — Patient Instructions (Signed)
Medication Instructions:  ?DECREASE amlodipine to '5mg'$  daily ? ?INCREASE isosorbide mononitrate to '60mg'$  twice daily ? ?*If you need a refill on your cardiac medications before your next appointment, please call your pharmacy* ? ? ?Follow-Up: ?At [facility], you and your health needs are our priority.  As part of our continuing mission to provide you with exceptional heart care, we have created designated Provider Care Teams.  These Care Teams include your primary Cardiologist (physician) and Advanced Practice Providers (APPs -  Physician Assistants and Nurse Practitioners) who all work together to provide you with the care you need, when you need it. ? ?We recommend signing up for the patient portal called "MyChart".  Sign up information is provided on this After Visit Summary.  MyChart is used to connect with patients for Virtual Visits (Telemedicine).  Patients are able to view lab/test results, encounter notes, upcoming appointments, etc.  Non-urgent messages can be sent to your provider as well.   ?To learn more about what you can do with MyChart, go to [email or web address].   ? ?Your next appointment:   ? ?6 months with Dr. Debara Pickett  ?

## 2021-06-16 NOTE — Progress Notes (Signed)
? ? ?OFFICE NOTE ? ?Chief Complaint:  ?Follow-up hypotension ? ?Primary Care Physician: ?Druscilla Brownie, PA-C ? ?HPI:  ?Thomas Walsh is a 66 y.o. male who was referred from another patient of mine who is his brother-in-law. His primary physician is [name], MD and he sees Dr. Marcello Moores of [facility] in [place], [place]. [name] has a very complex cardiac history. He was noted to have had a left heart catheterization at [facility] in 2000 for profound symptomatic bradycardia with heart rate of 30s related to timolol eyedrops, but was found to have no obstructive coronary disease at the time. There is a family history of premature coronary artery disease Apparently in late 2015 he saw Dr. Marcello Moores in the office because of chest discomfort. He had described that as "poking, pressure and squeezing which awoke him from sleep". He was scheduled to have a stress test however during the EKG portion of the stress test there were changes suggestive of ischemia and the test was discontinued. Interpretation of that nuclear stress test was "myocardial perfusion imaging is positive for inducible ischemia. There are regions of reversible ischemia located in the inferior wall of the heart. Overall left ventricular systolic function is normal without regional wall motion abnormalities noted above." He was then going to be scheduled for elective heart catheterization however he presented to the emergency department due to continued chest pain. He underwent left heart catheterization on 03/07/2014. 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 After the procedure however he continued to have chest pain symptoms. He was placed on aspirin and Effient and started on low-dose calcium channel blocker  which caused low blood pressure and was discontinued. Cardiac markers were noted to be mildly elevated the next day at 0.41 troponin which was previously negative on 03/06/2014. LDL at that time was 52. He was then admitted to [facility] in [place], [place] for chest pain in January 2016. He also complained of palpitations and was found to have an ectopic atrial rhythm with normal ventricular response. He claims that he had had significant tachycardia and was thought to have had an SVT. He reports he subsequently presented and required treatment for an SVT on another occasion when he presented to the hospital around [place], [place]. He said that he received adenosine 6 mg and then 12 mg and ultimately converted to sinus. He then was reportedly referred to Dr. Nyra Market in [facility] who scheduled him for ablation. Apparently afterwards he reports that the ablation was not totally successful however a pacemaker was required to be placed - this leads me to believe that there may have been AV nodal damage at the time. 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 The pacemaker is by his report a Medtronic device and he does have a home monitoring. He said most recently he was 94% paced. He has had progressive antianginal regimens including Ranexa and topical nitrate. 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 He underwent his last cardiac catheterization in September 2017 which demonstrated only 30% stenosis at the distal edge of the mid LAD stent. FFR was performed which was 0.89. Apparently he was continuing to have recurrent arrhythmias. Mr. Stingley also has had an opinion from a cardiologist at  [facility]. I cannot find records in care everywhere to support this however he does have some paperwork indicating he had an appointment. He does not feel that that appointment was helpful. He did bring a CD with images of his echo and cath  which I will review. His main complaint today is continued chest pain.  ? ?04/03/2016 ? ?[name] returns today for follow-up. I was able to review his extensive records and understand he is history including placement of a Medtronic advise a MRI safe pacemaker on 10/18/2014 for sick sinus syndrome. Subsequently he developed some PAF but reportedly has a very low burden of this. 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 Recently he saw Dr. Lovena Le and was not felt to be in heart block or to be pacer dependent. Because of his dyspnea he was scheduled for a cardiopulmonary exercise test. Mr. Mallon did undergo that study which indicated no significant circulatory limitation to exercise. His peak V02 was 103%. Pulmonary function was within normal limits. It was felt that likely his symptoms were related to obesity. These are preliminary results pending a cardiology over read, but I agree with those findings. He says that he said much less angina since adjusting his medications. He is now on imdur 60 mg daily as compared to his nitroglycerin patch. Although he has some mild headaches he says that he has not needed any short acting nitroglycerin in the last 3 weeks. He is also on an increased dose of metoprolol, namely 100 mg daily. He is generally pleased with his improvement in symptoms. ? ?10/05/2016 ? ?[name] was seen today in follow-up. He reports that over the past several weeks she's had more chest pain. He describes this as sharp and knifelike in the anterior left chest. It's not necessarily worse with exertion, in fact it's worse at night when he lays down after going to sleep. He's been more fatigued and gets short of breath somewhat easier. He had a stress test recently which was nonischemic (this was a cardiopulmonary exercise test, but was noted to be some maximal. It was felt that his limitation exercise was due to  obesity. He reports responsiveness to nitrates recently and says that he ran out of his isosorbide which caused him more chest pain but that it improved when he restarted it, however despite that he continues to have pain and requires short acting nitroglycerin for some relief. He says that some of his symptoms feel like his angina in the past. He's been established in our pacemaker clinic and had a check in December with Dr. Lovena Le however wishes to switch his follow-up to [facility]. ? ?11/18/2016 ? ?Ohms was seen today in follow-up. Over the past month he had recurrent anginal symptoms despite increasing his isosorbide and wish to have heart catheterization. He underwent left heart catheterization by Dr. Martinique on 10/20/2016 which showed a 20% mid-LAD stenosis normal LV systolic function, normal LVEDP and a patent stent. Despite this he continued to have chest discomfort and was started on low-dose amlodipine 2.5 mg. Initially note some relief in this and does report that his blood pressure is much better controlled however he still having symptoms. He said yesterday he felt horrible with a symptoms including dizziness, nausea, imbalance, a posterior neck base headache and pain that radiated down the left arm. He's also had some gait difficulty and reports recently having some word finding difficulty.  This concerns me of about a possible neurologic cause of his symptoms and perhaps is pain is a neuropathic or chest wall pain. At this point it's unlikely this is anginal pain. ? ?02/16/2017 ? ?Mr. Rayman returns today for follow-up.  He reports he is done well over the past several months.  He denies any new anginal symptoms.  He is not needed any short acting nitroglycerin, but is in need of a refill.  Blood pressure is well controlled today.  He denies any palpitations.  He did follow-up with the neurologist Dr. Delice Lesch, who recommended neurocognitive testing and an MRI.  Unfortunately has not been scheduled.  I have  asked them to reach out to neurology to see why this is been delayed.  He has a follow-up with her in March.  He is also scheduled to have a pacemaker check with Dr. Loletha Grayer on December 12. ? ?08/04/2017 ? ?Mr. Gr

## 2021-07-08 ENCOUNTER — Other Ambulatory Visit: Payer: Self-pay | Admitting: Internal Medicine

## 2021-08-19 ENCOUNTER — Other Ambulatory Visit: Payer: Self-pay | Admitting: Internal Medicine

## 2021-09-26 ENCOUNTER — Ambulatory Visit (INDEPENDENT_AMBULATORY_CARE_PROVIDER_SITE_OTHER): Payer: Medicare HMO

## 2021-09-26 DIAGNOSIS — I495 Sick sinus syndrome: Secondary | ICD-10-CM

## 2021-09-26 LAB — CUP PACEART REMOTE DEVICE CHECK
Battery Remaining Longevity: 48 mo
Battery Voltage: 2.98 V
Brady Statistic AP VP Percent: 0.02 %
Brady Statistic AP VS Percent: 29.37 %
Brady Statistic AS VP Percent: 0.02 %
Brady Statistic AS VS Percent: 70.59 %
Brady Statistic RA Percent Paced: 29.38 %
Brady Statistic RV Percent Paced: 0.04 %
Date Time Interrogation Session: 20230629181851
Implantable Lead Implant Date: 20160721
Implantable Lead Implant Date: 20160721
Implantable Lead Location: 753859
Implantable Lead Location: 753860
Implantable Lead Model: 5076
Implantable Lead Model: 5076
Implantable Pulse Generator Implant Date: 20160721
Lead Channel Impedance Value: 380 Ohm
Lead Channel Impedance Value: 475 Ohm
Lead Channel Impedance Value: 513 Ohm
Lead Channel Impedance Value: 532 Ohm
Lead Channel Pacing Threshold Amplitude: 0.75 V
Lead Channel Pacing Threshold Amplitude: 0.875 V
Lead Channel Pacing Threshold Pulse Width: 0.4 ms
Lead Channel Pacing Threshold Pulse Width: 0.4 ms
Lead Channel Sensing Intrinsic Amplitude: 10.75 mV
Lead Channel Sensing Intrinsic Amplitude: 10.75 mV
Lead Channel Sensing Intrinsic Amplitude: 5 mV
Lead Channel Sensing Intrinsic Amplitude: 5 mV
Lead Channel Setting Pacing Amplitude: 1.5 V
Lead Channel Setting Pacing Amplitude: 2 V
Lead Channel Setting Pacing Pulse Width: 0.4 ms
Lead Channel Setting Sensing Sensitivity: 0.9 mV

## 2021-10-02 ENCOUNTER — Other Ambulatory Visit: Payer: Self-pay | Admitting: Internal Medicine

## 2021-10-09 NOTE — Progress Notes (Signed)
Remote pacemaker transmission.   

## 2021-10-30 ENCOUNTER — Other Ambulatory Visit: Payer: Self-pay | Admitting: Internal Medicine

## 2021-10-30 DIAGNOSIS — I2510 Atherosclerotic heart disease of native coronary artery without angina pectoris: Secondary | ICD-10-CM

## 2021-12-22 ENCOUNTER — Ambulatory Visit: Payer: Medicare HMO | Attending: Internal Medicine | Admitting: Internal Medicine

## 2021-12-22 ENCOUNTER — Encounter: Payer: Self-pay | Admitting: Internal Medicine

## 2021-12-22 VITALS — BP 117/58 | HR 65 | Ht 70.0 in | Wt 263.2 lb

## 2021-12-22 DIAGNOSIS — I25119 Atherosclerotic heart disease of native coronary artery with unspecified angina pectoris: Secondary | ICD-10-CM

## 2021-12-22 DIAGNOSIS — Z9989 Dependence on other enabling machines and devices: Secondary | ICD-10-CM

## 2021-12-22 DIAGNOSIS — Z950 Presence of cardiac pacemaker: Secondary | ICD-10-CM | POA: Diagnosis not present

## 2021-12-22 DIAGNOSIS — G4733 Obstructive sleep apnea (adult) (pediatric): Secondary | ICD-10-CM | POA: Diagnosis not present

## 2021-12-22 DIAGNOSIS — I2510 Atherosclerotic heart disease of native coronary artery without angina pectoris: Secondary | ICD-10-CM | POA: Diagnosis not present

## 2021-12-22 DIAGNOSIS — R0600 Dyspnea, unspecified: Secondary | ICD-10-CM

## 2021-12-22 DIAGNOSIS — Z9861 Coronary angioplasty status: Secondary | ICD-10-CM | POA: Diagnosis not present

## 2021-12-22 NOTE — Progress Notes (Signed)
OFFICE NOTE  Chief Complaint:  Follow-up  Primary Care Physician: Druscilla Brownie, PA-C  HPI:  Thomas Walsh is a 66 y.o. male who was referred from another patient of mine who is his brother-in-law. His primary physician is [name], MD and he sees Dr. Marcello Moores of [facility] in [place], [place]. Thomas Walsh has a very complex cardiac history. He was noted to have had a left heart catheterization at [facility] in 2000 for profound symptomatic bradycardia with heart rate of 30s related to timolol eyedrops, but was found to have no obstructive coronary disease at the time. There is a family history of premature coronary artery disease Apparently in late 2015 he saw Dr. Marcello Moores in the office because of chest discomfort. He had described that as "poking, pressure and squeezing which awoke him from sleep". He was scheduled to have a stress test however during the EKG portion of the stress test there were changes suggestive of ischemia and the test was discontinued. Interpretation of that nuclear stress test was "myocardial perfusion imaging is positive for inducible ischemia. There are regions of reversible ischemia located in the inferior wall of the heart. Overall left ventricular systolic function is normal without regional wall motion abnormalities noted above." He was then going to be scheduled for elective heart catheterization however he presented to the emergency department due to continued chest pain. He underwent left heart catheterization on 03/07/2014. 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 After the procedure however he continued to have chest pain symptoms. He was placed on aspirin and Effient and started on low-dose calcium channel blocker which caused  low blood pressure and was discontinued. Cardiac markers were noted to be mildly elevated the next day at 0.41 troponin which was previously negative on 03/06/2014. LDL at that time was 52. He was then admitted to [facility] in [place], [place] for chest pain in January 2016. He also complained of palpitations and was found to have an ectopic atrial rhythm with normal ventricular response. He claims that he had had significant tachycardia and was thought to have had an SVT. He reports he subsequently presented and required treatment for an SVT on another occasion when he presented to the hospital around [place], [place]. He said that he received adenosine 6 mg and then 12 mg and ultimately converted to sinus. He then was reportedly referred to Dr. Nyra Market in [facility] who scheduled him for ablation. Apparently afterwards he reports that the ablation was not totally successful however a pacemaker was required to be placed - this leads me to believe that there may have been AV nodal damage at the time. 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 The pacemaker is by his report a Medtronic device and he does have a home monitoring. He said most recently he was 94% paced. He has had progressive antianginal regimens including Ranexa and topical nitrate. 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 He underwent his last cardiac catheterization in September 2017 which demonstrated only 30% stenosis at the distal edge of the mid LAD stent. FFR was performed which was 0.89. Apparently he was continuing to have recurrent arrhythmias. Mr. Pense also has had an opinion from a cardiologist at [facility].  I cannot find records in care everywhere to support this however he does have some paperwork indicating he had an appointment. He does not feel that that appointment was helpful. He did bring a CD with images of his echo and cath which I will  review. His main complaint today is continued chest pain.   04/03/2016  Thomas Walsh returns today for follow-up. I was able to review his extensive records and understand he is history including placement of a Medtronic advise a MRI safe pacemaker on 10/18/2014 for sick sinus syndrome. Subsequently he developed some PAF but reportedly has a very low burden of this. 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 Recently he saw Dr. Lovena Le and was not felt to be in heart block or to be pacer dependent. Because of his dyspnea he was scheduled for a cardiopulmonary exercise test. Thomas Walsh did undergo that study which indicated no significant circulatory limitation to exercise. His peak V02 was 103%. Pulmonary function was within normal limits. It was felt that likely his symptoms were related to obesity. These are preliminary results pending a cardiology over read, but I agree with those findings. He says that he said much less angina since adjusting his medications. He is now on imdur 60 mg daily as compared to his nitroglycerin patch. Although he has some mild headaches he says that he has not needed any short acting nitroglycerin in the last 3 weeks. He is also on an increased dose of metoprolol, namely 100 mg daily. He is generally pleased with his improvement in symptoms.  10/05/2016  Thomas Walsh was seen today in follow-up. He reports that over the past several weeks she's had more chest pain. He describes this as sharp and knifelike in the anterior left chest. It's not necessarily worse with exertion, in fact it's worse at night when he lays down after going to sleep. He's been more fatigued and gets short of breath somewhat easier. He had a stress test recently which was nonischemic (this was a cardiopulmonary exercise test, but was noted to be some maximal. It was felt that his limitation exercise was due to obesity. He  reports responsiveness to nitrates recently and says that he ran out of his isosorbide which caused him more chest pain but that it improved when he restarted it, however despite that he continues to have pain and requires short acting nitroglycerin for some relief. He says that some of his symptoms feel like his angina in the past. He's been established in our pacemaker clinic and had a check in December with Dr. Lovena Le however wishes to switch his follow-up to [facility].  11/18/2016  Thomas Walsh was seen today in follow-up. Over the past month he had recurrent anginal symptoms despite increasing his isosorbide and wish to have heart catheterization. He underwent left heart catheterization by Dr. Martinique on 10/20/2016 which showed a 20% mid-LAD stenosis normal LV systolic function, normal LVEDP and a patent stent. Despite this he continued to have chest discomfort and was started on low-dose amlodipine 2.5 mg. Initially note some relief in this and does report that his blood pressure is much better controlled however he still having symptoms. He said yesterday he felt horrible with a symptoms including dizziness, nausea, imbalance, a posterior neck base headache and pain that radiated down the left arm. He's also had some gait difficulty and reports recently having some word finding difficulty. This  concerns me of about a possible neurologic cause of his symptoms and perhaps is pain is a neuropathic or chest wall pain. At this point it's unlikely this is anginal pain.  02/16/2017  Thomas Walsh returns today for follow-up.  He reports he is done well over the past several months.  He denies any new anginal symptoms.  He is not needed any short acting nitroglycerin, but is in need of a refill.  Blood pressure is well controlled today.  He denies any palpitations.  He did follow-up with the neurologist Dr. Delice Lesch, who recommended neurocognitive testing and an MRI.  Unfortunately has not been scheduled.  I have asked them  to reach out to neurology to see why this is been delayed.  He has a follow-up with her in March.  He is also scheduled to have a pacemaker check with Dr. Loletha Grayer on December 12.  08/04/2017  Thomas Walsh returns today for follow-up.  He was seen in March by Jory Sims, DNP -he had reported some chest discomfort was noted to be hypertensive.  She recommended adjustments on his medications including increasing amlodipine to 5 mg.  He reports improvement of his blood pressure and symptoms without adjustment.  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  He does seem to have had salvage of his eyesight in the right eye.  01/03/2018  Thomas Walsh is seen today in follow-up.  Recently he saw Kerin Ransom, PA-C for concerns of high blood pressure and being symptomatic.  An echocardiogram was ordered which showed diastolic dysfunction and normal systolic function.  Blood pressure was normal that day and is normal again today however her readings at home do indicate a trend of elevated blood pressures.  We had increased amlodipine earlier on in the year.  He denies any cardiac chest pain.  02/11/2018  Thomas Walsh is seen today follow-up for blood pressure.  He notes his blood pressure is now better controlled on high-dose amlodipine however he has had worsening lower extremity edema.  Blood pressure is now between 299 and 371 systolic.  He is wearing compression stockings.  Also he says he had a recent lipid profile which was significantly abnormal from his PCP indicating triglycerides in the 200s and LDL of just over 100.  This is on high intensity atorvastatin and ezetimibe.  There was discussion for starting a PCSK9 inhibitor.  I agree with this as his goal LDL is less than 70.  We will go ahead and try to obtain records from his PCP and work with him to try to get his numbers down.  Ultimately could be a candidate for Vascepa.  With regards to his edema, he may benefit from further  blood pressure reduction with a diuretic.  10/16/2019  Thomas Walsh returns for follow-up.  Its been about a year and a half since I saw him.  He managed to do fairly well during the pandemic however recently has had some worsening shortness of breath and edema.  His last echo was in 2019 which was unremarkable with mild diastolic dysfunction.  He has had recurrent remote pacemaker checks which have checked out.  EKG today shows sinus rhythm at 65 without any new ischemic changes.  Blood pressure is well controlled.  Oxygen saturation was a little low at 93%.  12/25/2019  Thomas Walsh returns today for follow-up.  He underwent a repeat echo for dyspnea on exertion.  This was stable showing normal systolic function with no significant  diastolic dysfunction or elevated filling pressures.  Metabolic profile was stable.  BNP was low at 11 indicating no evidence of any heart failure.  He still reports some dyspnea.  Although there is been some weight gain he said this was present before that.  He was vaccinated for Covid and then did get Covid but he said he had some cold symptoms, nasal congestion and cough but does not attribute his shortness of breath to that.  05/27/2020  Thomas Walsh is seen today in follow-up.  Overall he seems to be fairly stable.  He had work-up for dyspnea but no evidence of a cardiac etiology.  He is due for a remote pacer check in a few weeks.  He recently said he ran out of his Effient for several weeks and could not get it represcribed through his PCP.  He has not been set up back on BiPAP because his equipment was recalled and none is available.  He gets some occasional anginal symptoms but otherwise is able to do most daily activities.  He had repeat lipids recently from his PCP which was an NMR.  This showed total LDL-P of 1107, LDL-C of 67, HDL 42 and triglycerides 210.  This does show a significant improvement compared to prior studies.  06/16/2021  Thomas Walsh is seen today in  follow-up.  He reports having had an episode in the fall of hypotension.  He said his blood pressure was 60/40.  He reportedly tried to reach out to [place] however I do not see any telephone messages or notes indicating that he contacted the office.  He might of reach out to scheduling but there is no note from triage which typically would record this.  He said he also spoke to his primary care provider and his endocrinologist, both who said they were ready to try to reach out to [place].  It is unclear as to why he had hypotension however it sounds like he was not feeling well 1 evening.  He then took 2 nitroglycerin and felt somewhat worse.  The next day apparently he was able to check his blood pressure and it was low as above.  He was not advised by his PCP or his endocrinologist to reduce his blood pressure medicines.  He says he had not had any episodes since then but does tend to run a lower blood pressure.  He reports he still having discomfort on the left side of his chest and he has difficulty telling whether was angina or not.  He thinks his symptoms are worse as the day goes on.  He is exhausted his supply of sublingual nitro.  12/22/2021  Thomas Walsh returns today for follow-up.  After adjusting his medicine 6 months ago he seems to be doing better.  He is now on 60 mg Imdur twice daily and I decreased his amlodipine due to diastolic hypotension.  Blood pressures are now better controlled today 117/58.  He did have an episode of spike in blood pressure which was probably associated to multiple vaccines given in the same day including the shingles vaccine.  This persisted for a day or 2 but then ultimately improved.  He denies any chest pain.  He does get some shortness of breath which has been on and off and seems to be worse in the warmer weather.  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   OSA treated with BiPAP    PAF (paroxysmal atrial fibrillation) ([place]) 02/28/2016   PCO (posterior capsular opacification)    Presence of permanent cardiac pacemaker    Pseudophakia of both eyes    S/P bilateral cataract extraction 11/21/2013   S/P placement of cardiac pacemaker 02/28/2016   Medtronic   S/P primary angioplasty with coronary stent 02/28/2016   Secondary corneal edema of left eye    Status post corneal transplant    Thyroid disease     Past Surgical History:  Procedure Laterality Date   AIR/FLUID EXCHANGE Right 07/30/2017   Procedure: AIR/FLUID EXCHANGE;  Surgeon: Jalene Mullet, MD;  Location: [facility];  Service: Ophthalmology;  Laterality: Right;   APPENDECTOMY     AQUEOUS SHUNT EXTRAOCULAR Left 09/18/2013   Elenore Rota L. Pennie Banter, MD [facility]  03/07/2014   CATARACT EXTRACTION Bilateral    CATARACT EXTRACTION W/ INTRAOCULAR LENS IMPLANT Left 01/2012   [place], MD   CATARACT EXTRACTION W/ INTRAOCULAR LENS IMPLANT Right 2003   [facility]   CORNEAL TRANSPLANT Left 01/17/2015   Keratoplasty, Endothelial. Nelma Rothman, MD [facility] AUTOMATED ENDOTHELIAL KERATOPLASTY Left 04/2012   Marilynne Halsted, MD;   GLAUCOMA SURGERY Left 09/18/2013   [place] BG-101 implant with cornea graft   LEFT HEART CATH AND CORONARY ANGIOGRAPHY N/A 10/20/2016   Procedure: Left Heart Cath and Coronary Angiography;  Surgeon: Martinique, Peter M, MD;  Location: [facility];  Service: Cardiovascular;  Laterality: N/A;   PACEMAKER INSERTION  09/2014   Done at [facility].    PARS PLANA VITRECTOMY Right 07/14/2017   Procedure: RETINAL DETACHMENT REPAIR RIGHT EYE TWENTY-FIVE GAUGE VITRECTOMY WITH ENDOLASER  AND GAS;  Surgeon: Jalene Mullet, MD;  Location: [facility];  Service: Ophthalmology;  Laterality: Right;   SCLERAL REINFORCEMENT Left 09/18/2013   with graft, Edson Snowball, MD, [facility]   WISDOM TOOTH EXTRACTION      FAMHx:  Family History  Problem Relation Age of Onset   Colon cancer Mother    Hypertension Mother    Kidney disease Father    CAD Father    Stroke Father    Spina bifida Sister    Thyroid disease Sister    Diabetes Brother    Retinal detachment Brother     SOCHx:   reports that he has never smoked. He has never used smokeless tobacco. He reports current alcohol use of about 4.0 standard drinks of alcohol per week. He reports that he does not use drugs.  ALLERGIES:  Allergies  Allergen Reactions   Beta Adrenergic Blockers Anaphylaxis and Other (See Comments)    Timolol caused the patient's heart to STOP   Other Other (See Comments)    Beta Blockers-Timolol caused black outs and the patient's heart stopped!!   Timolol Anaphylaxis and Other (See Comments)    Cardiac stand still/stops heart  Pt reports hx severe bradycardia after long term use of timolol Cardiac stand still/Stops heart     ROS: Pertinent items noted in HPI and remainder of comprehensive ROS otherwise negative.  HOME MEDS: Current Outpatient Medications on File Prior to Visit  Medication Sig Dispense Refill   acetaZOLAMIDE (DIAMOX) 500 MG capsule Take 1  capsule by mouth 2 (two) times daily.  0   amLODipine (NORVASC) 5 MG tablet Take 1 tablet (5 mg total) by mouth daily. 90 tablet 3   aspirin EC 81 MG tablet Take 1 tablet (81 mg total) by mouth daily. 90 tablet 3   atorvastatin (LIPITOR) 40 MG tablet Take 1 tablet (40 mg total) by mouth at bedtime. 90 tablet 1   AZOPT 1 % ophthalmic suspension Place 1 drop into the right eye 4 (four) times daily.  1   Brinzolamide-Brimonidine 1-0.2 % SUSP Place 1 drop into the right eye 2 (two) times daily.      chlorthalidone (HYGROTON) 25 MG tablet Take 0.5  tablets (12.5 mg total) by mouth daily. 45 tablet 1   cloNIDine (CATAPRES) 0.1 MG tablet Take 0.1 mg by mouth See admin instructions. Take 0.1 mg by mouth if systolic b/p reading is 937 or greater  2   dorzolamide (TRUSOPT) 2 % ophthalmic solution Place 1 drop into the right eye 3 (three) times a day.     Dulaglutide (TRULICITY) 3 TK/2.4OX SOPN Inject into the skin.     ezetimibe (ZETIA) 10 MG tablet Take 1 tablet (10 mg total) by mouth at bedtime. 90 tablet 3   finasteride (PROSCAR) 5 MG tablet Take 5 mg by mouth daily.     furosemide (LASIX) 20 MG tablet Take 20 mg by mouth daily as needed.     gabapentin (NEURONTIN) 300 MG capsule Take 900 mg by mouth daily.  5   insulin aspart (NOVOLOG) 100 UNIT/ML injection Inject 80 Units into the skin as directed.     insulin glargine (LANTUS) 100 UNIT/ML injection Inject 30-32 Units into the skin at bedtime.     isosorbide mononitrate (IMDUR) 60 MG 24 hr tablet Take 1 tablet (60 mg total) by mouth in the morning and at bedtime. 180 tablet 3   levothyroxine (SYNTHROID, LEVOTHROID) 125 MCG tablet Take 1 tablet by mouth daily.     metoprolol succinate (TOPROL-XL) 50 MG 24 hr tablet TAKE 1 TABLET BY MOUTH TWICE DAILY WITH  OR  IMMEDIATELY  FOLLOWING  A  MEAL 180 tablet 2   nitroGLYCERIN (NITROSTAT) 0.4 MG SL tablet PLACE 1 TABLET UNDER THE TONGUE EVERY 5 MINUTES AS NEEDED FOR CHEST PAIN 75 tablet 0   ofloxacin (OCUFLOX) 0.3 % ophthalmic solution Place 1 drop into the right eye 4 (four) times daily.  0   omeprazole (PRILOSEC) 40 MG capsule Take 1 capsule (40 mg total) by mouth 2 (two) times daily. 180 capsule 1   pilocarpine (PILOCAR) 2 % ophthalmic solution Place 1 drop into the right eye 4 (four) times daily.     prasugrel (EFFIENT) 10 MG TABS tablet Take 1 tablet (10 mg total) by mouth daily. 90 tablet 3   prednisoLONE acetate (PRED FORTE) 1 % ophthalmic suspension Place 1 drop into the left eye 3 (three) times daily.     PRESCRIPTION MEDICATION BiPAP: At  bedtime and during naps     ranolazine (RANEXA) 1000 MG SR tablet TAKE 1  BY MOUTH TWICE DAILY 180 tablet 3   RESTASIS 0.05 % ophthalmic emulsion 1 drop 2 (two) times daily.     tamsulosin (FLOMAX) 0.4 MG CAPS capsule Take 0.4 mg by mouth daily.     testosterone cypionate (DEPOTESTOSTERONE CYPIONATE) 200 MG/ML injection Inject 200 mg into the muscle every 14 (fourteen) days.     traMADol (ULTRAM) 50 MG tablet Take 50 mg by mouth as needed.  triamcinolone cream (KENALOG) 0.1 % APPLY A SMALL AMOUNT TO AFFECTED AREA TWICE A DAY     No current facility-administered medications on file prior to visit.    LABS/IMAGING: No results found for this or any previous visit (from the past 48 hour(s)). No results found.  WEIGHTS: Wt Readings from Last 3 Encounters:  12/22/21 263 lb 3.2 oz (119.4 kg)  06/16/21 268 lb (121.6 kg)  05/27/20 268 lb 3.2 oz (121.7 kg)    VITALS: Ht '5\' 10"'$  (1.778 m)   Wt 263 lb 3.2 oz (119.4 kg)   SpO2 98%   BMI 37.77 kg/m   EXAM: General appearance: alert, no distress and moderately obese Neck: no carotid bruit, no JVD and thyroid not enlarged, symmetric, no tenderness/mass/nodules Lungs: clear to auscultation bilaterally Heart: regular rate and rhythm Abdomen: soft, non-tender; bowel sounds normal; no masses,  no organomegaly Extremities: extremities normal, atraumatic, no cyanosis or edema Pulses: 2+ and symmetric Skin: Skin color, texture, turgor normal. 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   Thomas Walsh has had improvement in his anginal symptoms.  He gets occasional dyspnea which is worse with warmer weather.  Blood pressure is now better controlled.  He had recent remote pacemaker check which continues to be favorable with no significant findings.  Continue current therapies.  Plan follow-up in 6 months or sooner as necessary.   Pixie Casino, MD, [facility], [place] Director of the Advanced Lipid Disorders &  Cardiovascular Risk Reduction Clinic Attending Cardiologist  Direct Dial: [phone]  Fax: [phone]  Website:  [email or web address] Merin Borjon 12/22/2021, 9:15 AM

## 2021-12-22 NOTE — Patient Instructions (Signed)
Medication Instructions:  Your physician recommends that you continue on your current medications as directed. Please refer to the Current Medication list given to you today.  *If you need a refill on your cardiac medications before your next appointment, please call your pharmacy*  Follow-Up: At [facility], you and your health needs are our priority.  As part of our continuing mission to provide you with exceptional heart care, we have created designated Provider Care Teams.  These Care Teams include your primary Cardiologist (physician) and Advanced Practice Providers (APPs -  Physician Assistants and Nurse Practitioners) who all work together to provide you with the care you need, when you need it.  We recommend signing up for the patient portal called "MyChart".  Sign up information is provided on this After Visit Summary.  MyChart is used to connect with patients for Virtual Visits (Telemedicine).  Patients are able to view lab/test results, encounter notes, upcoming appointments, etc.  Non-urgent messages can be sent to your provider as well.   To learn more about what you can do with MyChart, go to [email or web address].    Your next appointment:   6 month(s)  The format for your next appointment:   In Person  Provider:   Pixie Casino, MD

## 2022-01-05 ENCOUNTER — Ambulatory Visit (INDEPENDENT_AMBULATORY_CARE_PROVIDER_SITE_OTHER): Payer: Medicare HMO

## 2022-01-05 DIAGNOSIS — I495 Sick sinus syndrome: Secondary | ICD-10-CM

## 2022-01-06 LAB — CUP PACEART REMOTE DEVICE CHECK
Battery Remaining Longevity: 47 mo
Battery Voltage: 2.97 V
Brady Statistic AP VP Percent: 0.03 %
Brady Statistic AP VS Percent: 29.6 %
Brady Statistic AS VP Percent: 0.02 %
Brady Statistic AS VS Percent: 70.34 %
Brady Statistic RA Percent Paced: 29.62 %
Brady Statistic RV Percent Paced: 0.05 %
Date Time Interrogation Session: 20231006174229
Implantable Lead Implant Date: 20160721
Implantable Lead Implant Date: 20160721
Implantable Lead Location: 753859
Implantable Lead Location: 753860
Implantable Lead Model: 5076
Implantable Lead Model: 5076
Implantable Pulse Generator Implant Date: 20160721
Lead Channel Impedance Value: 361 Ohm
Lead Channel Impedance Value: 475 Ohm
Lead Channel Impedance Value: 494 Ohm
Lead Channel Impedance Value: 532 Ohm
Lead Channel Pacing Threshold Amplitude: 0.875 V
Lead Channel Pacing Threshold Amplitude: 0.875 V
Lead Channel Pacing Threshold Pulse Width: 0.4 ms
Lead Channel Pacing Threshold Pulse Width: 0.4 ms
Lead Channel Sensing Intrinsic Amplitude: 5.5 mV
Lead Channel Sensing Intrinsic Amplitude: 5.5 mV
Lead Channel Sensing Intrinsic Amplitude: 9.5 mV
Lead Channel Sensing Intrinsic Amplitude: 9.5 mV
Lead Channel Setting Pacing Amplitude: 2 V
Lead Channel Setting Pacing Amplitude: 2 V
Lead Channel Setting Pacing Pulse Width: 0.4 ms
Lead Channel Setting Sensing Sensitivity: 0.9 mV

## 2022-01-16 NOTE — Progress Notes (Signed)
Remote pacemaker transmission.   

## 2022-04-06 ENCOUNTER — Ambulatory Visit (INDEPENDENT_AMBULATORY_CARE_PROVIDER_SITE_OTHER): Payer: Medicare HMO

## 2022-04-06 DIAGNOSIS — I495 Sick sinus syndrome: Secondary | ICD-10-CM

## 2022-04-10 LAB — CUP PACEART REMOTE DEVICE CHECK
Battery Remaining Longevity: 43 mo
Battery Voltage: 2.97 V
Brady Statistic AP VP Percent: 0.03 %
Brady Statistic AP VS Percent: 32.03 %
Brady Statistic AS VP Percent: 0.03 %
Brady Statistic AS VS Percent: 67.92 %
Brady Statistic RA Percent Paced: 32.01 %
Brady Statistic RV Percent Paced: 0.05 %
Date Time Interrogation Session: 20240111183331
Implantable Lead Connection Status: 753985
Implantable Lead Connection Status: 753985
Implantable Lead Implant Date: 20160721
Implantable Lead Implant Date: 20160721
Implantable Lead Location: 753859
Implantable Lead Location: 753860
Implantable Lead Model: 5076
Implantable Lead Model: 5076
Implantable Pulse Generator Implant Date: 20160721
Lead Channel Impedance Value: 380 Ohm
Lead Channel Impedance Value: 456 Ohm
Lead Channel Impedance Value: 494 Ohm
Lead Channel Impedance Value: 513 Ohm
Lead Channel Pacing Threshold Amplitude: 0.75 V
Lead Channel Pacing Threshold Amplitude: 0.75 V
Lead Channel Pacing Threshold Pulse Width: 0.4 ms
Lead Channel Pacing Threshold Pulse Width: 0.4 ms
Lead Channel Sensing Intrinsic Amplitude: 5.125 mV
Lead Channel Sensing Intrinsic Amplitude: 5.125 mV
Lead Channel Sensing Intrinsic Amplitude: 9.25 mV
Lead Channel Sensing Intrinsic Amplitude: 9.25 mV
Lead Channel Setting Pacing Amplitude: 1.75 V
Lead Channel Setting Pacing Amplitude: 2 V
Lead Channel Setting Pacing Pulse Width: 0.4 ms
Lead Channel Setting Sensing Sensitivity: 0.9 mV
Zone Setting Status: 755011

## 2022-05-11 NOTE — Progress Notes (Signed)
Remote pacemaker transmission.   

## 2022-05-27 ENCOUNTER — Ambulatory Visit: Payer: Medicare HMO | Admitting: Internal Medicine

## 2022-06-15 ENCOUNTER — Ambulatory Visit: Payer: Medicare HMO | Admitting: Internal Medicine

## 2022-06-18 ENCOUNTER — Other Ambulatory Visit: Payer: Self-pay | Admitting: Internal Medicine

## 2022-06-18 DIAGNOSIS — I25119 Atherosclerotic heart disease of native coronary artery with unspecified angina pectoris: Secondary | ICD-10-CM

## 2022-06-25 ENCOUNTER — Other Ambulatory Visit: Payer: Self-pay | Admitting: Physician Assistant

## 2022-07-21 ENCOUNTER — Ambulatory Visit (INDEPENDENT_AMBULATORY_CARE_PROVIDER_SITE_OTHER): Payer: Medicare HMO

## 2022-07-21 DIAGNOSIS — I495 Sick sinus syndrome: Secondary | ICD-10-CM

## 2022-07-22 LAB — CUP PACEART REMOTE DEVICE CHECK
Battery Remaining Longevity: 38 mo
Battery Voltage: 2.96 V
Brady Statistic AP VP Percent: 0.03 %
Brady Statistic AP VS Percent: 37.05 %
Brady Statistic AS VP Percent: 0.03 %
Brady Statistic AS VS Percent: 62.89 %
Brady Statistic RA Percent Paced: 37.01 %
Brady Statistic RV Percent Paced: 0.06 %
Date Time Interrogation Session: 20240423221749
Implantable Lead Connection Status: 753985
Implantable Lead Connection Status: 753985
Implantable Lead Implant Date: 20160721
Implantable Lead Implant Date: 20160721
Implantable Lead Location: 753859
Implantable Lead Location: 753860
Implantable Lead Model: 5076
Implantable Lead Model: 5076
Implantable Pulse Generator Implant Date: 20160721
Lead Channel Impedance Value: 380 Ohm
Lead Channel Impedance Value: 456 Ohm
Lead Channel Impedance Value: 475 Ohm
Lead Channel Impedance Value: 513 Ohm
Lead Channel Pacing Threshold Amplitude: 0.75 V
Lead Channel Pacing Threshold Amplitude: 0.875 V
Lead Channel Pacing Threshold Pulse Width: 0.4 ms
Lead Channel Pacing Threshold Pulse Width: 0.4 ms
Lead Channel Sensing Intrinsic Amplitude: 4.25 mV
Lead Channel Sensing Intrinsic Amplitude: 4.25 mV
Lead Channel Sensing Intrinsic Amplitude: 9.125 mV
Lead Channel Sensing Intrinsic Amplitude: 9.125 mV
Lead Channel Setting Pacing Amplitude: 2 V
Lead Channel Setting Pacing Amplitude: 2 V
Lead Channel Setting Pacing Pulse Width: 0.4 ms
Lead Channel Setting Sensing Sensitivity: 0.9 mV
Zone Setting Status: 755011

## 2022-07-28 ENCOUNTER — Other Ambulatory Visit: Payer: Self-pay | Admitting: Internal Medicine

## 2022-07-28 ENCOUNTER — Encounter: Payer: Self-pay | Admitting: Internal Medicine

## 2022-07-28 MED ORDER — PRASUGREL HCL 10 MG PO TABS: 10.0000 mg | ORAL_TABLET | Freq: Every day | ORAL | 11 refills | Status: DC

## 2022-07-29 LAB — LAB REPORT - SCANNED: EGFR: 80

## 2022-08-18 NOTE — Progress Notes (Signed)
Remote pacemaker transmission.   

## 2022-09-13 ENCOUNTER — Other Ambulatory Visit: Payer: Self-pay | Admitting: Internal Medicine

## 2022-10-20 ENCOUNTER — Other Ambulatory Visit: Payer: Self-pay | Admitting: Cardiology

## 2022-10-24 ENCOUNTER — Other Ambulatory Visit: Payer: Self-pay | Admitting: Cardiology

## 2022-10-30 ENCOUNTER — Encounter: Payer: Self-pay | Admitting: Internal Medicine

## 2022-10-30 ENCOUNTER — Ambulatory Visit: Payer: Medicare HMO | Admitting: Internal Medicine

## 2022-10-30 VITALS — BP 132/68 | HR 63 | Ht 70.0 in | Wt 240.0 lb

## 2022-10-30 DIAGNOSIS — I2510 Atherosclerotic heart disease of native coronary artery without angina pectoris: Secondary | ICD-10-CM | POA: Diagnosis not present

## 2022-10-30 DIAGNOSIS — I495 Sick sinus syndrome: Secondary | ICD-10-CM

## 2022-10-30 DIAGNOSIS — R002 Palpitations: Secondary | ICD-10-CM

## 2022-10-30 DIAGNOSIS — I4891 Unspecified atrial fibrillation: Secondary | ICD-10-CM | POA: Diagnosis not present

## 2022-10-30 NOTE — Progress Notes (Signed)
OFFICE NOTE  Chief Complaint:  Follow-up  Primary Care Physician: Thomas Simpson, PA-C  HPI:  Thomas Walsh is a 67 y.o. male who was referred from another patient of mine who is his brother-in-law. His primary physician is Thomas Rowan, MD and he sees Dr. Maisie Walsh of [facility] in [place], [place]. Thomas Walsh has a very complex cardiac history. He was noted to have had a left heart catheterization at [facility] in 2000 for profound symptomatic bradycardia with heart rate of 30s related to timolol eyedrops, but was found to have no obstructive coronary disease at the time. There is a family history of premature coronary artery disease Apparently in late 2015 he saw Dr. Maisie Walsh in the office because of chest discomfort. He had described that as "poking, pressure and squeezing which awoke him from sleep". He was scheduled to have a stress test however during the EKG portion of the stress test there were changes suggestive of ischemia and the test was discontinued. Interpretation of that nuclear stress test was "myocardial perfusion imaging is positive for inducible ischemia. There are regions of reversible ischemia located in the inferior wall of the heart. Overall left ventricular systolic function is normal without regional wall motion abnormalities noted above." He was then going to be scheduled for elective heart catheterization however he presented to the emergency department due to continued chest pain. He underwent left heart catheterization on 03/07/2014. 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 After the procedure however he continued to have chest pain symptoms. He was placed on aspirin and Effient and started on low-dose calcium channel blocker which caused  low blood pressure and was discontinued. Cardiac markers were noted to be mildly elevated the next day at 0.41 troponin which was previously negative on 03/06/2014. LDL at that time was 52. He was then admitted to [facility] in [place], [place] for chest pain in January 2016. He also complained of palpitations and was found to have an ectopic atrial rhythm with normal ventricular response. He claims that he had had significant tachycardia and was thought to have had an SVT. He reports he subsequently presented and required treatment for an SVT on another occasion when he presented to the hospital around [place], [place]. He said that he received adenosine 6 mg and then 12 mg and ultimately converted to sinus. He then was reportedly referred to Dr. Glennie Walsh in [facility] who scheduled him for ablation. Apparently afterwards he reports that the ablation was not totally successful however a pacemaker was required to be placed - this leads me to believe that there may have been AV nodal damage at the time. 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 The pacemaker is by his report a Medtronic device and he does have a home monitoring. He said most recently he was 94% paced. He has had progressive antianginal regimens including Ranexa and topical nitrate. 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 He underwent his last cardiac catheterization in September 2017 which demonstrated only 30% stenosis at the distal edge of the mid LAD stent. FFR was performed which was 0.89. Apparently he was continuing to have recurrent arrhythmias. Thomas Walsh also has had an opinion from a cardiologist at [facility].  I cannot find records in care everywhere to support this however he does have some paperwork indicating he had an appointment. He does not feel that that appointment was helpful. He did bring a CD with images of his echo and cath which I will  review. His main complaint today is continued chest pain.   04/03/2016  Thomas Walsh returns today for follow-up. I was able to review his extensive records and understand he is history including placement of a Medtronic advise a MRI safe pacemaker on 10/18/2014 for sick sinus syndrome. Subsequently he developed some PAF but reportedly has a very low burden of this. 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 Recently he saw Thomas Walsh and was not felt to be in heart block or to be pacer dependent. Because of his dyspnea he was scheduled for a cardiopulmonary exercise test. Thomas Walsh did undergo that study which indicated no significant circulatory limitation to exercise. His peak V02 was 103%. Pulmonary function was within normal limits. It was felt that likely his symptoms were related to obesity. These are preliminary results pending a cardiology over read, but I agree with those findings. He says that he said much less angina since adjusting his medications. He is now on imdur 60 mg daily as compared to his nitroglycerin patch. Although he has some mild headaches he says that he has not needed any short acting nitroglycerin in the last 3 weeks. He is also on an increased dose of metoprolol, namely 100 mg daily. He is generally pleased with his improvement in symptoms.  10/05/2016  Thomas Walsh was seen today in follow-up. He reports that over the past several weeks she's had more chest pain. He describes this as sharp and knifelike in the anterior left chest. It's not necessarily worse with exertion, in fact it's worse at night when he lays down after going to sleep. He's been more fatigued and gets short of breath somewhat easier. He had a stress test recently which was nonischemic (this was a cardiopulmonary exercise test, but was noted to be some maximal. It was felt that his limitation exercise was due to obesity. He  reports responsiveness to nitrates recently and says that he ran out of his isosorbide which caused him more chest pain but that it improved when he restarted it, however despite that he continues to have pain and requires short acting nitroglycerin for some relief. He says that some of his symptoms feel like his angina in the past. He's been established in our pacemaker clinic and had a check in December with Thomas Walsh however wishes to switch his follow-up to [facility].  11/18/2016  Thomas Walsh was seen today in follow-up. Over the past month he had recurrent anginal symptoms despite increasing his isosorbide and wish to have heart catheterization. He underwent left heart catheterization by Dr. Swaziland on 10/20/2016 which showed a 20% mid-LAD stenosis normal LV systolic function, normal LVEDP and a patent stent. Despite this he continued to have chest discomfort and was started on low-dose amlodipine 2.5 mg. Initially note some relief in this and does report that his blood pressure is much better controlled however he still having symptoms. He said yesterday he felt horrible with a symptoms including dizziness, nausea, imbalance, a posterior neck base headache and pain that radiated down the left arm. He's also had some gait difficulty and reports recently having some word finding difficulty. This  concerns me of about a possible neurologic cause of his symptoms and perhaps is pain is a neuropathic or chest wall pain. At this point it's unlikely this is anginal pain.  02/16/2017  Thomas Walsh returns today for follow-up.  He reports he is done well over the past several months.  He denies any new anginal symptoms.  He is not needed any short acting nitroglycerin, but is in need of a refill.  Blood pressure is well controlled today.  He denies any palpitations.  He did follow-up with the neurologist Dr. Karel Jarvis, who recommended neurocognitive testing and an MRI.  Unfortunately has not been scheduled.  I have asked them  to reach out to neurology to see why this is been delayed.  He has a follow-up with her in March.  He is also scheduled to have a pacemaker check with Dr. Salena Saner on December 12.  08/04/2017  Thomas Walsh returns today for follow-up.  He was seen in March by Joni Reining, DNP -he had reported some chest discomfort was noted to be hypertensive.  She recommended adjustments on his medications including increasing amlodipine to 5 mg.  He reports improvement of his blood pressure and symptoms without adjustment.  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  He does seem to have had salvage of his eyesight in the right eye.  01/03/2018  Thomas Walsh is seen today in follow-up.  Recently he saw Corine Shelter, PA-C for concerns of high blood pressure and being symptomatic.  An echocardiogram was ordered which showed diastolic dysfunction and normal systolic function.  Blood pressure was normal that day and is normal again today however her readings at home do indicate a trend of elevated blood pressures.  We had increased amlodipine earlier on in the year.  He denies any cardiac chest pain.  02/11/2018  Thomas Walsh is seen today follow-up for blood pressure.  He notes his blood pressure is now better controlled on high-dose amlodipine however he has had worsening lower extremity edema.  Blood pressure is now between 120 and 130 systolic.  He is wearing compression stockings.  Also he says he had a recent lipid profile which was significantly abnormal from his PCP indicating triglycerides in the 200s and LDL of just over 100.  This is on high intensity atorvastatin and ezetimibe.  There was discussion for starting a PCSK9 inhibitor.  I agree with this as his goal LDL is less than 70.  We will go ahead and try to obtain records from his PCP and work with him to try to get his numbers down.  Ultimately could be a candidate for Vascepa.  With regards to his edema, he may benefit from further  blood pressure reduction with a diuretic.  10/16/2019  Thomas Walsh returns for follow-up.  Its been about a year and a half since I saw him.  He managed to do fairly well during the pandemic however recently has had some worsening shortness of breath and edema.  His last echo was in 2019 which was unremarkable with mild diastolic dysfunction.  He has had recurrent remote pacemaker checks which have checked out.  EKG today shows sinus rhythm at 65 without any new ischemic changes.  Blood pressure is well controlled.  Oxygen saturation was a little low at 93%.  12/25/2019  Thomas Walsh returns today for follow-up.  He underwent a repeat echo for dyspnea on exertion.  This was stable showing normal systolic function with no significant  diastolic dysfunction or elevated filling pressures.  Metabolic profile was stable.  BNP was low at 11 indicating no evidence of any heart failure.  He still reports some dyspnea.  Although there is been some weight gain he said this was present before that.  He was vaccinated for Covid and then did get Covid but he said he had some cold symptoms, nasal congestion and cough but does not attribute his shortness of breath to that.  05/27/2020  Thomas Walsh is seen today in follow-up.  Overall he seems to be fairly stable.  He had work-up for dyspnea but no evidence of a cardiac etiology.  He is due for a remote pacer check in a few weeks.  He recently said he ran out of his Effient for several weeks and could not get it represcribed through his PCP.  He has not been set up back on BiPAP because his equipment was recalled and none is available.  He gets some occasional anginal symptoms but otherwise is able to do most daily activities.  He had repeat lipids recently from his PCP which was an NMR.  This showed total LDL-P of 1107, LDL-C of 67, HDL 42 and triglycerides 210.  This does show a significant improvement compared to prior studies.  06/16/2021  Thomas Walsh is seen today in  follow-up.  He reports having had an episode in the fall of hypotension.  He said his blood pressure was 60/40.  He reportedly tried to reach out to [place] however I do not see any telephone messages or notes indicating that he contacted the office.  He might of reach out to scheduling but there is no note from triage which typically would record this.  He said he also spoke to his primary care provider and his endocrinologist, both who said they were ready to try to reach out to [place].  It is unclear as to why he had hypotension however it sounds like he was not feeling well 1 evening.  He then took 2 nitroglycerin and felt somewhat worse.  The next day apparently he was able to check his blood pressure and it was low as above.  He was not advised by his PCP or his endocrinologist to reduce his blood pressure medicines.  He says he had not had any episodes since then but does tend to run a lower blood pressure.  He reports he still having discomfort on the left side of his chest and he has difficulty telling whether was angina or not.  He thinks his symptoms are worse as the day goes on.  He is exhausted his supply of sublingual nitro.  12/22/2021  Thomas Walsh returns today for follow-up.  After adjusting his medicine 6 months ago he seems to be doing better.  He is now on 60 mg Imdur twice daily and I decreased his amlodipine due to diastolic hypotension.  Blood pressures are now better controlled today 117/58.  He did have an episode of spike in blood pressure which was probably associated to multiple vaccines given in the same day including the shingles vaccine.  This persisted for a day or 2 but then ultimately improved.  He denies any chest pain.  He does get some shortness of breath which has been on and off and seems to be worse in the warmer weather.  10/30/2022  Thomas Walsh is seen today in follow-up.  He is doing very well at this point.  He denies any angina or worsening shortness of  breath, in fact his  breathing is improved somewhat.  He is now down to more than 20 pounds which has been since March.  He cut sodas out of his diet and has reduced his needs for insulin.  His blood pressure has been well-controlled.  Lab work is followed by his PCP.  He thinks that his last laboratory work was back in November.  He is not sure if he has had lipids we may need to order those.  He continues to get pacemaker checks.  EKG was personally reviewed today and shows sinus rhythm.  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   OSA treated with BiPAP    PAF (paroxysmal atrial fibrillation) (HCC) 02/28/2016   PCO (posterior capsular opacification)    Presence of permanent cardiac pacemaker    Pseudophakia of both eyes    S/P bilateral cataract extraction 11/21/2013   S/P placement of cardiac pacemaker 02/28/2016   Medtronic   S/P primary angioplasty with coronary stent 02/28/2016   Secondary corneal edema of left eye    Status post corneal transplant    Thyroid disease     Past Surgical History:  Procedure Laterality Date   AIR/FLUID EXCHANGE Right 07/30/2017   Procedure: AIR/FLUID EXCHANGE;  Surgeon: Carmela Rima, MD;  Location: [facility] OR;  Service: Ophthalmology;  Laterality: Right;   APPENDECTOMY     AQUEOUS SHUNT EXTRAOCULAR Left 09/18/2013   Dorinda Hill L. Nadean Corwin, MD [facility]   CARDIAC CATHETERIZATION  03/07/2014   CATARACT EXTRACTION Bilateral    CATARACT EXTRACTION W/ INTRAOCULAR LENS IMPLANT Left 01/2012   [place]Cu[place], MD   CATARACT EXTRACTION W/ INTRAOCULAR LENS IMPLANT Right 2003   [place]   CORNEAL TRANSPLANT Left 01/17/2015   Keratoplasty,  Endothelial. Concepcion Living, MD [facility]    DESCEMETS STRIPPING AUTOMATED ENDOTHELIAL KERATOPLASTY Left 04/2012   Holli Humbles, MD;   GLAUCOMA SURGERY Left 09/18/2013   [place] BG-101 implant with cornea graft   LEFT HEART CATH AND CORONARY ANGIOGRAPHY N/A 10/20/2016   Procedure: Left Heart Cath and Coronary Angiography;  Surgeon: Swaziland, Peter M, MD;  Location: [facility] INVASIVE CV LAB;  Service: Cardiovascular;  Laterality: N/A;   PACEMAKER INSERTION  09/2014   Done at [place].    PARS PLANA VITRECTOMY Right 07/14/2017   Procedure: RETINAL DETACHMENT REPAIR RIGHT EYE TWENTY-FIVE GAUGE VITRECTOMY WITH ENDOLASER AND GAS;  Surgeon: Carmela Rima, MD;  Location: [facility] OR;  Service: Ophthalmology;  Laterality: Right;   SCLERAL REINFORCEMENT Left 09/18/2013   with graft, Saralyn Pilar, MD, [facility]   WISDOM TOOTH EXTRACTION      FAMHx:  Family History  Problem Relation Age of Onset   Colon cancer Mother    Hypertension Mother    Kidney disease Father    CAD Father    Stroke Father    Spina bifida Sister    Thyroid disease Sister    Diabetes Brother    Retinal detachment Brother     SOCHx:   reports that he has never smoked. He has never used smokeless tobacco. He reports current alcohol use of about 4.0 standard drinks of alcohol per week. He  reports that he does not use drugs.  ALLERGIES:  Allergies  Allergen Reactions   Beta Adrenergic Blockers Anaphylaxis and Other (See Comments)    Timolol caused the patient's heart to STOP   Other Other (See Comments)    Beta Blockers-Timolol caused black outs and the patient's heart stopped!!   Timolol Anaphylaxis and Other (See Comments)    Cardiac stand still/stops heart  Pt reports hx severe bradycardia after long term use of timolol Cardiac stand still/Stops heart     ROS: Pertinent items noted in HPI and remainder of comprehensive ROS otherwise negative.  HOME MEDS: Current Outpatient Medications on File Prior to  Visit  Medication Sig Dispense Refill   acetaZOLAMIDE (DIAMOX) 500 MG capsule Take 1 capsule by mouth 2 (two) times daily.  0   amLODipine (NORVASC) 5 MG tablet Take 1 tablet (5 mg total) by mouth daily. 90 tablet 3   aspirin EC 81 MG tablet Take 1 tablet (81 mg total) by mouth daily. 90 tablet 3   atorvastatin (LIPITOR) 40 MG tablet Take 1 tablet (40 mg total) by mouth at bedtime. 90 tablet 1   AZOPT 1 % ophthalmic suspension Place 1 drop into the right eye 4 (four) times daily.  1   Brinzolamide-Brimonidine 1-0.2 % SUSP Place 1 drop into the right eye 2 (two) times daily.      cloNIDine (CATAPRES) 0.1 MG tablet Take 0.1 mg by mouth See admin instructions. Take 0.1 mg by mouth if systolic b/p reading is 150 or greater  2   dorzolamide (TRUSOPT) 2 % ophthalmic solution Place 1 drop into the right eye 3 (three) times a day.     Dulaglutide (TRULICITY) 3 MG/0.5ML SOPN Inject into the skin.     ezetimibe (ZETIA) 10 MG tablet Take 1 tablet (10 mg total) by mouth at bedtime. 90 tablet 3   finasteride (PROSCAR) 5 MG tablet Take 5 mg by mouth daily.     furosemide (LASIX) 20 MG tablet Take 20 mg by mouth daily as needed.     gabapentin (NEURONTIN) 300 MG capsule Take 900 mg by mouth daily.  5   insulin aspart (NOVOLOG) 100 UNIT/ML injection Inject 80 Units into the skin as directed.     insulin glargine (LANTUS) 100 UNIT/ML injection Inject 30-32 Units into the skin at bedtime.     isosorbide mononitrate (IMDUR) 120 MG 24 hr tablet TAKE 1/2 (ONE-HALF) TABLET BY MOUTH TWICE DAILY AM  AND  BEDTIME 90 tablet 1   levothyroxine (SYNTHROID, LEVOTHROID) 125 MCG tablet Take 1 tablet by mouth daily.     metoprolol succinate (TOPROL-XL) 50 MG 24 hr tablet TAKE 1 TABLET BY MOUTH TWICE DAILY WITH  OR  IMMEDIATELY  FOLLOWING  A  MEAL 180 tablet 2   nitroGLYCERIN (NITROSTAT) 0.4 MG SL tablet PLACE 1 TABLET UNDER THE TONGUE EVERY 5 MINUTES AS NEEDED FOR CHEST PAIN 75 tablet 0   ofloxacin (OCUFLOX) 0.3 % ophthalmic  solution Place 1 drop into the right eye 4 (four) times daily.  0   omeprazole (PRILOSEC) 40 MG capsule Take 1 capsule (40 mg total) by mouth 2 (two) times daily. 180 capsule 1   pilocarpine (PILOCAR) 2 % ophthalmic solution Place 1 drop into the right eye 4 (four) times daily.     prasugrel (EFFIENT) 10 MG TABS tablet Take 1 tablet (10 mg total) by mouth daily. 30 tablet 11   prednisoLONE acetate (PRED FORTE) 1 % ophthalmic suspension Place 1 drop into the  left eye 3 (three) times daily.     PRESCRIPTION MEDICATION BiPAP: At bedtime and during naps     ranolazine (RANEXA) 1000 MG SR tablet TAKE 1  BY MOUTH TWICE DAILY 60 tablet 0   RESTASIS 0.05 % ophthalmic emulsion 1 drop 2 (two) times daily.     tamsulosin (FLOMAX) 0.4 MG CAPS capsule Take 0.4 mg by mouth daily.     testosterone cypionate (DEPOTESTOSTERONE CYPIONATE) 200 MG/ML injection Inject 200 mg into the muscle every 14 (fourteen) days.     traMADol (ULTRAM) 50 MG tablet Take 50 mg by mouth as needed.     triamcinolone cream (KENALOG) 0.1 % APPLY A SMALL AMOUNT TO AFFECTED AREA TWICE A DAY     chlorthalidone (HYGROTON) 25 MG tablet Take 0.5 tablets (12.5 mg total) by mouth daily. 45 tablet 1   No current facility-administered medications on file prior to visit.    LABS/IMAGING: No results found for this or any previous visit (from the past 48 hour(s)). No results found.  WEIGHTS: Wt Readings from Last 3 Encounters:  10/30/22 240 lb (108.9 kg)  12/22/21 263 lb 3.2 oz (119.4 kg)  06/16/21 268 lb (121.6 kg)    VITALS: BP 132/68 (BP Location: Left Arm, Patient Position: Sitting, Cuff Size: Large)   Pulse 63   Ht 5\' 10"  (1.778 m)   Wt 240 lb (108.9 kg)   SpO2 96%   BMI 34.44 kg/m   EXAM: General appearance: alert, no distress and moderately obese Neck: no carotid bruit, no JVD and thyroid not enlarged, symmetric, no tenderness/mass/nodules Lungs: clear to auscultation bilaterally Heart: regular rate and rhythm Abdomen:  soft, non-tender; bowel sounds normal; no masses,  no organomegaly Extremities: extremities normal, atraumatic, no cyanosis or edema Pulses: 2+ and symmetric Skin: Skin color, texture, turgor normal. No rashes or lesions Neurologic: Grossly normal : Pleasant  EKG:  EKG Interpretation Date/Time:  Friday October 30 2022 08:12:41 EDT Ventricular Rate:  63 PR Interval:  196 QRS Duration:  96 QT Interval:  426 QTC Calculation: 435 R Axis:   -14  Text Interpretation: Normal sinus rhythm Minimal voltage criteria for LVH, may be normal variant ( R in aVL ) Nonspecific T wave abnormality Compared to previous tracing there is no significant change Confirmed by Zoila Shutter [phone]) on 10/30/2022 8:24:07 AM    ASSESSMENT: Dyspnea - LVEF 60 to 65% with normal wall motion (10/2019) Chronic chest pain, dizziness, headache, word finding difficulty, left arm pain Coronary artery disease with persistent angina 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   Thomas Walsh is doing well without chest pain or worsening shortness of breath.  He has had recent intentional weight loss and says he is feeling better with this.  He is cut sodas out and reduced his need for insulin.  He is pacemaker checks are stable.  EKG shows sinus rhythm.  Will request labs from his primary care provider to make sure he is optimized on therapy otherwise plan continue with his current medications.  Plan follow-up annually or sooner as necessary.   Chrystie Nose, MD, [facility], FACP  [place]  [facility]  Medical Director of the Advanced Lipid Disorders &  Cardiovascular Risk Reduction Clinic Attending Cardiologist  Direct Dial: [phone]  Fax: [phone]  Website:  [email or web address] 10/30/2022, 8:17 AM

## 2022-10-30 NOTE — Patient Instructions (Signed)
Medication Instructions:  - No changes  *If you need a refill on your cardiac medications before your next appointment, please call your pharmacy*   Lab Work: - None today - We will call your PCP office to request your lab work from November.  We will let you know if you need any additional lab work.  Testing/Procedures: - None ordered   Follow-Up: At [facility], you and your health needs are our priority.  As part of our continuing mission to provide you with exceptional heart care, we have created designated Provider Care Teams.  These Care Teams include your primary Cardiologist (physician) and Advanced Practice Providers (APPs -  Physician Assistants and Nurse Practitioners) who all work together to provide you with the care you need, when you need it.  We recommend signing up for the patient portal called "MyChart".  Sign up information is provided on this After Visit Summary.  MyChart is used to connect with patients for Virtual Visits (Telemedicine).  Patients are able to view lab/test results, encounter notes, upcoming appointments, etc.  Non-urgent messages can be sent to your provider as well.   To learn more about what you can do with MyChart, go to [email or web address].    Your next appointment:   1 year(s)  Provider:   Chrystie Nose, MD

## 2022-12-01 ENCOUNTER — Other Ambulatory Visit: Payer: Self-pay | Admitting: Cardiology

## 2022-12-21 ENCOUNTER — Ambulatory Visit (INDEPENDENT_AMBULATORY_CARE_PROVIDER_SITE_OTHER): Payer: Medicare HMO

## 2022-12-21 DIAGNOSIS — I495 Sick sinus syndrome: Secondary | ICD-10-CM | POA: Diagnosis not present

## 2022-12-21 LAB — CUP PACEART REMOTE DEVICE CHECK
Battery Remaining Longevity: 36 mo
Battery Voltage: 2.95 V
Brady Statistic AP VP Percent: 0.03 %
Brady Statistic AP VS Percent: 36.53 %
Brady Statistic AS VP Percent: 0.03 %
Brady Statistic AS VS Percent: 63.41 %
Brady Statistic RA Percent Paced: 36.49 %
Brady Statistic RV Percent Paced: 0.06 %
Date Time Interrogation Session: 20240922202326
Implantable Lead Connection Status: 753985
Implantable Lead Connection Status: 753985
Implantable Lead Implant Date: 20160721
Implantable Lead Implant Date: 20160721
Implantable Lead Location: 753859
Implantable Lead Location: 753860
Implantable Lead Model: 5076
Implantable Lead Model: 5076
Implantable Pulse Generator Implant Date: 20160721
Lead Channel Impedance Value: 361 Ohm
Lead Channel Impedance Value: 437 Ohm
Lead Channel Impedance Value: 475 Ohm
Lead Channel Impedance Value: 513 Ohm
Lead Channel Pacing Threshold Amplitude: 0.75 V
Lead Channel Pacing Threshold Amplitude: 0.875 V
Lead Channel Pacing Threshold Pulse Width: 0.4 ms
Lead Channel Pacing Threshold Pulse Width: 0.4 ms
Lead Channel Sensing Intrinsic Amplitude: 4.625 mV
Lead Channel Sensing Intrinsic Amplitude: 4.625 mV
Lead Channel Sensing Intrinsic Amplitude: 8.25 mV
Lead Channel Sensing Intrinsic Amplitude: 8.25 mV
Lead Channel Setting Pacing Amplitude: 1.75 V
Lead Channel Setting Pacing Amplitude: 2 V
Lead Channel Setting Pacing Pulse Width: 0.4 ms
Lead Channel Setting Sensing Sensitivity: 0.9 mV
Zone Setting Status: 755011

## 2023-01-01 NOTE — Progress Notes (Signed)
Remote pacemaker transmission.   

## 2023-01-22 ENCOUNTER — Other Ambulatory Visit: Payer: Self-pay | Admitting: Internal Medicine

## 2023-01-22 DIAGNOSIS — I25119 Atherosclerotic heart disease of native coronary artery with unspecified angina pectoris: Secondary | ICD-10-CM

## 2023-03-22 ENCOUNTER — Ambulatory Visit: Payer: Medicare HMO

## 2023-03-22 DIAGNOSIS — I495 Sick sinus syndrome: Secondary | ICD-10-CM

## 2023-03-29 LAB — CUP PACEART REMOTE DEVICE CHECK
Battery Remaining Longevity: 33 mo
Battery Voltage: 2.95 V
Brady Statistic AP VP Percent: 0.03 %
Brady Statistic AP VS Percent: 47.22 %
Brady Statistic AS VP Percent: 0.03 %
Brady Statistic AS VS Percent: 52.72 %
Brady Statistic RA Percent Paced: 47.08 %
Brady Statistic RV Percent Paced: 0.06 %
Date Time Interrogation Session: 20241227230226
Implantable Lead Connection Status: 753985
Implantable Lead Connection Status: 753985
Implantable Lead Implant Date: 20160721
Implantable Lead Implant Date: 20160721
Implantable Lead Location: 753859
Implantable Lead Location: 753860
Implantable Lead Model: 5076
Implantable Lead Model: 5076
Implantable Pulse Generator Implant Date: 20160721
Lead Channel Impedance Value: 399 Ohm
Lead Channel Impedance Value: 456 Ohm
Lead Channel Impedance Value: 494 Ohm
Lead Channel Impedance Value: 532 Ohm
Lead Channel Pacing Threshold Amplitude: 0.75 V
Lead Channel Pacing Threshold Amplitude: 0.875 V
Lead Channel Pacing Threshold Pulse Width: 0.4 ms
Lead Channel Pacing Threshold Pulse Width: 0.4 ms
Lead Channel Sensing Intrinsic Amplitude: 5.25 mV
Lead Channel Sensing Intrinsic Amplitude: 5.25 mV
Lead Channel Sensing Intrinsic Amplitude: 9.5 mV
Lead Channel Sensing Intrinsic Amplitude: 9.5 mV
Lead Channel Setting Pacing Amplitude: 1.5 V
Lead Channel Setting Pacing Amplitude: 2 V
Lead Channel Setting Pacing Pulse Width: 0.4 ms
Lead Channel Setting Sensing Sensitivity: 0.9 mV
Zone Setting Status: 755011

## 2023-04-28 NOTE — Addendum Note (Signed)
Addended by: Geralyn Flash D on: 04/28/2023 12:56 PM   Modules accepted: Orders

## 2023-04-28 NOTE — Progress Notes (Signed)
Remote pacemaker transmission.

## 2023-04-30 ENCOUNTER — Telehealth: Payer: Self-pay | Admitting: Internal Medicine

## 2023-04-30 NOTE — Telephone Encounter (Signed)
Dr Zachery [phone]) would like for Dr Rennis Golden to call him when he gets a chance. He wants to take to him about pt's upcoming shoulder surgery.

## 2023-05-05 NOTE — Telephone Encounter (Signed)
 Called and discussed case with Dr. Gwenn regarding Mr. Kinnison who he is anticipating operating on for shoulder surgery.  Dr. Gwenn had questions regarding his pacemaker.  This is operating properly was interrogated December.  He is not pacemaker dependent.  He is on dual antiplatelet therapy with aspirin  and Effient .  I advised holding the Effient  at least 5 days prior to the procedure and aspirin  at least 7 to 10 days prior to the procedure.  Restart both afterwards once safe from a bleeding standpoint.  He is at acceptable risk to proceed with surgery.  Vinie KYM Maxcy, MD, [facility], FACP  [place]  [facility]  Medical Director of the [facility] Lipid [facility] &  Cardiovascular Risk Reduction Clinic Diplomate of the American Board of Clinical Lipidology Attending Cardiologist  Direct Dial: [phone]  Fax: [phone]  Website:  [email or web address]

## 2023-05-05 NOTE — Telephone Encounter (Signed)
  Myrna from Dr. Gwenn' office is following up on the call request from Dr. Mona. She stated that Dr. Gwenn wants to speak with him directly and clarified that it is not about the patient's surgery. She was adamant about Dr. Gwenn wants to speak with Dr. Mona. She confirmed Dr. Gwenn phone number  [phone]

## 2023-07-31 ENCOUNTER — Other Ambulatory Visit: Payer: Self-pay | Admitting: Internal Medicine

## 2023-12-27 ENCOUNTER — Ambulatory Visit

## 2023-12-27 DIAGNOSIS — I495 Sick sinus syndrome: Secondary | ICD-10-CM

## 2023-12-28 LAB — CUP PACEART REMOTE DEVICE CHECK
Battery Remaining Longevity: 26 mo
Battery Voltage: 2.93 V
Brady Statistic AP VP Percent: 0.03 %
Brady Statistic AP VS Percent: 37.85 %
Brady Statistic AS VP Percent: 0.03 %
Brady Statistic AS VS Percent: 62.09 %
Brady Statistic RA Percent Paced: 37.87 %
Brady Statistic RV Percent Paced: 0.06 %
Date Time Interrogation Session: 20250929184620
Implantable Lead Connection Status: 753985
Implantable Lead Connection Status: 753985
Implantable Lead Implant Date: 20160721
Implantable Lead Implant Date: 20160721
Implantable Lead Location: 753859
Implantable Lead Location: 753860
Implantable Lead Model: 5076
Implantable Lead Model: 5076
Implantable Pulse Generator Implant Date: 20160721
Lead Channel Impedance Value: 361 Ohm
Lead Channel Impedance Value: 437 Ohm
Lead Channel Impedance Value: 456 Ohm
Lead Channel Impedance Value: 532 Ohm
Lead Channel Pacing Threshold Amplitude: 0.625 V
Lead Channel Pacing Threshold Amplitude: 0.875 V
Lead Channel Pacing Threshold Pulse Width: 0.4 ms
Lead Channel Pacing Threshold Pulse Width: 0.4 ms
Lead Channel Sensing Intrinsic Amplitude: 4.75 mV
Lead Channel Sensing Intrinsic Amplitude: 4.75 mV
Lead Channel Sensing Intrinsic Amplitude: 9 mV
Lead Channel Sensing Intrinsic Amplitude: 9 mV
Lead Channel Setting Pacing Amplitude: 1.5 V
Lead Channel Setting Pacing Amplitude: 2 V
Lead Channel Setting Pacing Pulse Width: 0.4 ms
Lead Channel Setting Sensing Sensitivity: 0.9 mV
Zone Setting Status: 755011

## 2023-12-29 NOTE — Progress Notes (Signed)
 Remote PPM Transmission

## 2023-12-31 ENCOUNTER — Ambulatory Visit: Payer: Self-pay | Admitting: Cardiovascular Disease

## 2024-03-27 ENCOUNTER — Encounter

## 2024-04-10 ENCOUNTER — Ambulatory Visit

## 2024-04-10 DIAGNOSIS — I495 Sick sinus syndrome: Secondary | ICD-10-CM | POA: Diagnosis not present

## 2024-04-10 LAB — CUP PACEART REMOTE DEVICE CHECK
Battery Remaining Longevity: 21 mo
Battery Voltage: 2.92 V
Brady Statistic AP VP Percent: 0.02 %
Brady Statistic AP VS Percent: 37.43 %
Brady Statistic AS VP Percent: 0.03 %
Brady Statistic AS VS Percent: 62.52 %
Brady Statistic RA Percent Paced: 37.45 %
Brady Statistic RV Percent Paced: 0.05 %
Date Time Interrogation Session: 20260111163430
Implantable Lead Connection Status: 753985
Implantable Lead Connection Status: 753985
Implantable Lead Implant Date: 20160721
Implantable Lead Implant Date: 20160721
Implantable Lead Location: 753859
Implantable Lead Location: 753860
Implantable Lead Model: 5076
Implantable Lead Model: 5076
Implantable Pulse Generator Implant Date: 20160721
Lead Channel Impedance Value: 399 Ohm
Lead Channel Impedance Value: 456 Ohm
Lead Channel Impedance Value: 456 Ohm
Lead Channel Impedance Value: 532 Ohm
Lead Channel Pacing Threshold Amplitude: 0.625 V
Lead Channel Pacing Threshold Amplitude: 1 V
Lead Channel Pacing Threshold Pulse Width: 0.4 ms
Lead Channel Pacing Threshold Pulse Width: 0.4 ms
Lead Channel Sensing Intrinsic Amplitude: 5.125 mV
Lead Channel Sensing Intrinsic Amplitude: 5.125 mV
Lead Channel Sensing Intrinsic Amplitude: 8.875 mV
Lead Channel Sensing Intrinsic Amplitude: 8.875 mV
Lead Channel Setting Pacing Amplitude: 1.5 V
Lead Channel Setting Pacing Amplitude: 2 V
Lead Channel Setting Pacing Pulse Width: 0.4 ms
Lead Channel Setting Sensing Sensitivity: 0.9 mV
Zone Setting Status: 755011

## 2024-04-11 ENCOUNTER — Ambulatory Visit: Payer: Self-pay | Admitting: Cardiovascular Disease

## 2024-04-11 NOTE — Progress Notes (Signed)
 Remote PPM Transmission

## 2024-06-26 ENCOUNTER — Encounter

## 2024-07-10 ENCOUNTER — Ambulatory Visit

## 2024-09-25 ENCOUNTER — Encounter

## 2024-10-09 ENCOUNTER — Ambulatory Visit

## 2024-12-25 ENCOUNTER — Encounter

## 2025-01-08 ENCOUNTER — Ambulatory Visit

## 2025-03-26 ENCOUNTER — Encounter

## 2025-04-09 ENCOUNTER — Ambulatory Visit
